# Patient Record
Sex: Male | Born: 1965 | Race: White | Hispanic: No | Marital: Married | Smoking: Former smoker
Health system: Southern US, Community
[De-identification: ages and names within clinical notes are randomized; demographics above are authoritative.]

## PROBLEM LIST (undated history)

## (undated) DIAGNOSIS — E669 Obesity, unspecified: Secondary | ICD-10-CM

## (undated) DIAGNOSIS — K219 Gastro-esophageal reflux disease without esophagitis: Secondary | ICD-10-CM

## (undated) DIAGNOSIS — F32A Depression, unspecified: Secondary | ICD-10-CM

## (undated) DIAGNOSIS — E7800 Pure hypercholesterolemia, unspecified: Secondary | ICD-10-CM

## (undated) DIAGNOSIS — I2510 Atherosclerotic heart disease of native coronary artery without angina pectoris: Secondary | ICD-10-CM

## (undated) DIAGNOSIS — I10 Essential (primary) hypertension: Secondary | ICD-10-CM

## (undated) DIAGNOSIS — Z87891 Personal history of nicotine dependence: Secondary | ICD-10-CM

## (undated) DIAGNOSIS — G4733 Obstructive sleep apnea (adult) (pediatric): Secondary | ICD-10-CM

## (undated) DIAGNOSIS — E039 Hypothyroidism, unspecified: Secondary | ICD-10-CM

## (undated) DIAGNOSIS — R7303 Prediabetes: Secondary | ICD-10-CM

## (undated) DIAGNOSIS — G43909 Migraine, unspecified, not intractable, without status migrainosus: Secondary | ICD-10-CM

## (undated) DIAGNOSIS — N492 Inflammatory disorders of scrotum: Secondary | ICD-10-CM

## (undated) DIAGNOSIS — R413 Other amnesia: Secondary | ICD-10-CM

## (undated) DIAGNOSIS — E079 Disorder of thyroid, unspecified: Secondary | ICD-10-CM

## (undated) DIAGNOSIS — F329 Major depressive disorder, single episode, unspecified: Secondary | ICD-10-CM

## (undated) HISTORY — DX: Obesity, unspecified: E66.9

## (undated) HISTORY — PX: OTHER SURGICAL HISTORY: SHX169

## (undated) HISTORY — DX: Personal history of nicotine dependence: Z87.891

## (undated) HISTORY — DX: Other amnesia: R41.3

## (undated) HISTORY — DX: Atherosclerotic heart disease of native coronary artery without angina pectoris: I25.10

## (undated) HISTORY — DX: Obstructive sleep apnea (adult) (pediatric): G47.33

## (undated) HISTORY — PX: COLONOSCOPY: SHX174

## (undated) HISTORY — DX: Migraine, unspecified, not intractable, without status migrainosus: G43.909

---

## 2016-04-14 ENCOUNTER — Emergency Department (HOSPITAL_COMMUNITY): Payer: 59

## 2016-04-14 ENCOUNTER — Encounter (HOSPITAL_COMMUNITY): Payer: Self-pay

## 2016-04-14 ENCOUNTER — Emergency Department (HOSPITAL_COMMUNITY)
Admission: EM | Admit: 2016-04-14 | Discharge: 2016-04-14 | Disposition: A | Discharge status: Home / Self Care | Payer: 59 | Attending: Emergency Medicine | Admitting: Emergency Medicine

## 2016-04-14 DIAGNOSIS — I10 Essential (primary) hypertension: Secondary | ICD-10-CM | POA: Insufficient documentation

## 2016-04-14 DIAGNOSIS — Z79899 Other long term (current) drug therapy: Secondary | ICD-10-CM | POA: Diagnosis not present

## 2016-04-14 DIAGNOSIS — N50819 Testicular pain, unspecified: Secondary | ICD-10-CM | POA: Diagnosis present

## 2016-04-14 DIAGNOSIS — N451 Epididymitis: Secondary | ICD-10-CM | POA: Insufficient documentation

## 2016-04-14 DIAGNOSIS — N5082 Scrotal pain: Secondary | ICD-10-CM

## 2016-04-14 HISTORY — DX: Essential (primary) hypertension: I10

## 2016-04-14 HISTORY — DX: Disorder of thyroid, unspecified: E07.9

## 2016-04-14 HISTORY — DX: Pure hypercholesterolemia, unspecified: E78.00

## 2016-04-14 LAB — CBC
HCT: 38.3 % — ABNORMAL LOW (ref 39.0–52.0)
HEMOGLOBIN: 12.1 g/dL — AB (ref 13.0–17.0)
MCH: 26.8 pg (ref 26.0–34.0)
MCHC: 31.6 g/dL (ref 30.0–36.0)
MCV: 84.7 fL (ref 78.0–100.0)
Platelets: 328 10*3/uL (ref 150–400)
RBC: 4.52 MIL/uL (ref 4.22–5.81)
RDW: 13.4 % (ref 11.5–15.5)
WBC: 23.8 10*3/uL — ABNORMAL HIGH (ref 4.0–10.5)

## 2016-04-14 LAB — URINALYSIS, ROUTINE W REFLEX MICROSCOPIC
BACTERIA UA: NONE SEEN
BILIRUBIN URINE: NEGATIVE
Glucose, UA: NEGATIVE mg/dL
Hgb urine dipstick: NEGATIVE
KETONES UR: NEGATIVE mg/dL
Leukocytes, UA: NEGATIVE
Nitrite: NEGATIVE
PROTEIN: 100 mg/dL — AB
Specific Gravity, Urine: 1.029 (ref 1.005–1.030)
pH: 5 (ref 5.0–8.0)

## 2016-04-14 LAB — COMPREHENSIVE METABOLIC PANEL
ALT: 38 U/L (ref 17–63)
AST: 21 U/L (ref 15–41)
Albumin: 3.6 g/dL (ref 3.5–5.0)
Alkaline Phosphatase: 109 U/L (ref 38–126)
Anion gap: 12 (ref 5–15)
BILIRUBIN TOTAL: 1 mg/dL (ref 0.3–1.2)
BUN: 10 mg/dL (ref 6–20)
CHLORIDE: 98 mmol/L — AB (ref 101–111)
CO2: 27 mmol/L (ref 22–32)
CREATININE: 0.96 mg/dL (ref 0.61–1.24)
Calcium: 9.2 mg/dL (ref 8.9–10.3)
GFR calc Af Amer: >60 mL/min (ref >60)
GLUCOSE: 126 mg/dL — AB (ref 65–99)
Potassium: 3.5 mmol/L (ref 3.5–5.1)
Sodium: 137 mmol/L (ref 135–145)
Total Protein: 8.4 g/dL — ABNORMAL HIGH (ref 6.5–8.1)

## 2016-04-14 LAB — DIFFERENTIAL
Basophils Absolute: 0 10*3/uL (ref 0.0–0.1)
Basophils Relative: 0 %
EOS PCT: 0 %
Eosinophils Absolute: 0 10*3/uL (ref 0.0–0.7)
LYMPHS ABS: 1.5 10*3/uL (ref 0.7–4.0)
LYMPHS PCT: 6 %
MONO ABS: 2.7 10*3/uL — AB (ref 0.1–1.0)
MONOS PCT: 10 %
NEUTROS PCT: 84 %
Neutro Abs: 21.9 10*3/uL — ABNORMAL HIGH (ref 1.7–7.7)

## 2016-04-14 LAB — LIPASE, BLOOD: LIPASE: 14 U/L (ref 11–51)

## 2016-04-14 MED ORDER — SULFAMETHOXAZOLE-TRIMETHOPRIM 800-160 MG PO TABS: 1.0000 | ORAL_TABLET | Freq: Two times a day (BID) | ORAL | 0 refills | Status: DC

## 2016-04-14 MED ORDER — SULFAMETHOXAZOLE-TRIMETHOPRIM 800-160 MG PO TABS
1.0000 | ORAL_TABLET | Freq: Two times a day (BID) | ORAL | Status: DC
  Administered 2016-04-14: 1 via ORAL
  Filled 2016-04-14: qty 1

## 2016-04-14 MED ORDER — HYDROCODONE-ACETAMINOPHEN 5-325 MG PO TABS
1.0000 | ORAL_TABLET | Freq: Once | ORAL | Status: AC
  Administered 2016-04-14: 1 via ORAL
  Filled 2016-04-14: qty 1

## 2016-04-14 NOTE — ED Notes (Signed)
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 He remains in no distress.
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
## 2016-04-14 NOTE — Discharge Instructions (Signed)
Please call Dr. Noland Fordyce office for appointment for recheck in one week.  Take antibiotics as prescribed.  Please return without fail for worsening symptoms, including worsening pain/swelling, fevers, intractable vomiting, or any other symptoms concerning to you.
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
## 2016-04-15 ENCOUNTER — Other Ambulatory Visit: Payer: Self-pay | Admitting: Family Medicine

## 2016-04-15 DIAGNOSIS — N433 Hydrocele, unspecified: Secondary | ICD-10-CM

## 2016-04-15 LAB — URINE CULTURE: Culture: NO GROWTH

## 2016-04-25 ENCOUNTER — Observation Stay (HOSPITAL_COMMUNITY)
Admission: RE | Admit: 2016-04-25 | Discharge: 2016-04-26 | Disposition: A | Discharge status: Home / Self Care | Payer: 59 | Source: Ambulatory Visit | Attending: Urology | Admitting: Urology

## 2016-04-25 ENCOUNTER — Inpatient Hospital Stay (HOSPITAL_COMMUNITY): Payer: 59 | Admitting: Anesthesiology

## 2016-04-25 ENCOUNTER — Encounter (HOSPITAL_COMMUNITY)
Admission: RE | Disposition: A | Discharge status: Home / Self Care | Payer: Self-pay | Source: Ambulatory Visit | Attending: Urology

## 2016-04-25 ENCOUNTER — Other Ambulatory Visit: Payer: Self-pay | Admitting: Urology

## 2016-04-25 ENCOUNTER — Encounter (HOSPITAL_COMMUNITY): Payer: Self-pay | Admitting: *Deleted

## 2016-04-25 DIAGNOSIS — N454 Abscess of epididymis or testis: Secondary | ICD-10-CM | POA: Diagnosis not present

## 2016-04-25 DIAGNOSIS — I10 Essential (primary) hypertension: Secondary | ICD-10-CM | POA: Diagnosis not present

## 2016-04-25 DIAGNOSIS — Z87891 Personal history of nicotine dependence: Secondary | ICD-10-CM | POA: Insufficient documentation

## 2016-04-25 DIAGNOSIS — K219 Gastro-esophageal reflux disease without esophagitis: Secondary | ICD-10-CM | POA: Diagnosis not present

## 2016-04-25 DIAGNOSIS — N452 Orchitis: Secondary | ICD-10-CM | POA: Insufficient documentation

## 2016-04-25 DIAGNOSIS — S3131XA Laceration without foreign body of scrotum and testes, initial encounter: Secondary | ICD-10-CM | POA: Diagnosis present

## 2016-04-25 DIAGNOSIS — N501 Vascular disorders of male genital organs: Secondary | ICD-10-CM | POA: Insufficient documentation

## 2016-04-25 DIAGNOSIS — N492 Inflammatory disorders of scrotum: Secondary | ICD-10-CM | POA: Diagnosis present

## 2016-04-25 HISTORY — DX: Depression, unspecified: F32.A

## 2016-04-25 HISTORY — DX: Hypothyroidism, unspecified: E03.9

## 2016-04-25 HISTORY — DX: Major depressive disorder, single episode, unspecified: F32.9

## 2016-04-25 HISTORY — DX: Gastro-esophageal reflux disease without esophagitis: K21.9

## 2016-04-25 HISTORY — DX: Inflammatory disorders of scrotum: N49.2

## 2016-04-25 HISTORY — DX: Prediabetes: R73.03

## 2016-04-25 HISTORY — PX: INCISION AND DRAINAGE ABSCESS: SHX5864

## 2016-04-25 HISTORY — PX: ORCHIECTOMY: SHX2116

## 2016-04-25 LAB — CBC
HCT: 38.5 % — ABNORMAL LOW (ref 39.0–52.0)
Hemoglobin: 12.5 g/dL — ABNORMAL LOW (ref 13.0–17.0)
MCH: 27.8 pg (ref 26.0–34.0)
MCHC: 32.5 g/dL (ref 30.0–36.0)
MCV: 85.7 fL (ref 78.0–100.0)
PLATELETS: 413 10*3/uL — AB (ref 150–400)
RBC: 4.49 MIL/uL (ref 4.22–5.81)
RDW: 14 % (ref 11.5–15.5)
WBC: 20.8 10*3/uL — AB (ref 4.0–10.5)

## 2016-04-25 SURGERY — INCISION AND DRAINAGE, ABSCESS
Anesthesia: General | Laterality: Right

## 2016-04-25 MED ORDER — ONDANSETRON HCL 4 MG/2ML IJ SOLN: 4.0000 mg | INTRAMUSCULAR | Status: DC | PRN

## 2016-04-25 MED ORDER — METOCLOPRAMIDE HCL 5 MG/ML IJ SOLN: 10.0000 mg | Freq: Once | INTRAMUSCULAR | Status: DC | PRN

## 2016-04-25 MED ORDER — OXYCODONE HCL 5 MG PO TABS
5.0000 mg | ORAL_TABLET | ORAL | Status: DC | PRN
  Administered 2016-04-25 – 2016-04-26 (×2): 5 mg via ORAL
  Filled 2016-04-25 (×2): qty 1

## 2016-04-25 MED ORDER — MORPHINE SULFATE (PF) 4 MG/ML IV SOLN: 2.0000 mg | INTRAVENOUS | Status: DC | PRN

## 2016-04-25 MED ORDER — LACTATED RINGERS IV SOLN
INTRAVENOUS | Status: DC
  Administered 2016-04-25 – 2016-04-26 (×4): via INTRAVENOUS

## 2016-04-25 MED ORDER — PROPOFOL 10 MG/ML IV BOLUS
INTRAVENOUS | Status: AC
  Filled 2016-04-25: qty 20

## 2016-04-25 MED ORDER — PROPOFOL 10 MG/ML IV BOLUS
INTRAVENOUS | Status: DC | PRN
  Administered 2016-04-25: 200 mg via INTRAVENOUS

## 2016-04-25 MED ORDER — FENTANYL CITRATE (PF) 100 MCG/2ML IJ SOLN
25.0000 ug | INTRAMUSCULAR | Status: DC | PRN
  Administered 2016-04-25 (×2): 50 ug via INTRAVENOUS

## 2016-04-25 MED ORDER — CEFAZOLIN SODIUM-DEXTROSE 2-4 GM/100ML-% IV SOLN
INTRAVENOUS | Status: AC
  Filled 2016-04-25: qty 100

## 2016-04-25 MED ORDER — DEXAMETHASONE SODIUM PHOSPHATE 10 MG/ML IJ SOLN
INTRAMUSCULAR | Status: AC
Start: 2016-04-25 — End: 2016-04-25
  Filled 2016-04-25: qty 1

## 2016-04-25 MED ORDER — GENTAMICIN SULFATE 40 MG/ML IJ SOLN
400.0000 mg | Freq: Once | INTRAMUSCULAR | Status: AC
  Administered 2016-04-25: 400 mg via INTRAVENOUS
  Filled 2016-04-25: qty 10

## 2016-04-25 MED ORDER — MIDAZOLAM HCL 5 MG/5ML IJ SOLN
INTRAMUSCULAR | Status: DC | PRN
  Administered 2016-04-25: 2 mg via INTRAVENOUS

## 2016-04-25 MED ORDER — ACETAMINOPHEN 10 MG/ML IV SOLN
INTRAVENOUS | Status: AC
Start: 2016-04-25 — End: 2016-04-25
  Filled 2016-04-25: qty 100

## 2016-04-25 MED ORDER — ONDANSETRON HCL 4 MG/2ML IJ SOLN
INTRAMUSCULAR | Status: DC | PRN
Start: 2016-04-25 — End: 2016-04-25
  Administered 2016-04-25: 4 mg via INTRAVENOUS

## 2016-04-25 MED ORDER — ACETAMINOPHEN 325 MG PO TABS
650.0000 mg | ORAL_TABLET | ORAL | Status: DC | PRN
  Administered 2016-04-26: 650 mg via ORAL
  Filled 2016-04-25: qty 2

## 2016-04-25 MED ORDER — MEPERIDINE HCL 50 MG/ML IJ SOLN: 6.2500 mg | INTRAMUSCULAR | Status: DC | PRN

## 2016-04-25 MED ORDER — SODIUM CHLORIDE 0.9% FLUSH: 3.0000 mL | INTRAVENOUS | Status: DC | PRN

## 2016-04-25 MED ORDER — ACETAMINOPHEN 10 MG/ML IV SOLN
INTRAVENOUS | Status: DC | PRN
  Administered 2016-04-25: 1000 mg via INTRAVENOUS

## 2016-04-25 MED ORDER — SODIUM CHLORIDE 0.9 % IV SOLN: 250.0000 mL | INTRAVENOUS | Status: DC | PRN

## 2016-04-25 MED ORDER — METOPROLOL TARTRATE 50 MG PO TABS
50.0000 mg | ORAL_TABLET | Freq: Two times a day (BID) | ORAL | Status: DC
  Administered 2016-04-25 – 2016-04-26 (×2): 50 mg via ORAL
  Filled 2016-04-25 (×2): qty 1

## 2016-04-25 MED ORDER — PRAVASTATIN SODIUM 40 MG PO TABS
40.0000 mg | ORAL_TABLET | Freq: Every day | ORAL | Status: DC
  Administered 2016-04-26: 40 mg via ORAL
  Filled 2016-04-25 (×2): qty 1

## 2016-04-25 MED ORDER — MIDAZOLAM HCL 2 MG/2ML IJ SOLN
INTRAMUSCULAR | Status: AC
Start: 2016-04-25 — End: 2016-04-25
  Filled 2016-04-25: qty 2

## 2016-04-25 MED ORDER — CEFAZOLIN SODIUM-DEXTROSE 2-4 GM/100ML-% IV SOLN
2.0000 g | Freq: Once | INTRAVENOUS | Status: AC
  Administered 2016-04-25: 2 g via INTRAVENOUS

## 2016-04-25 MED ORDER — FENTANYL CITRATE (PF) 100 MCG/2ML IJ SOLN
INTRAMUSCULAR | Status: DC | PRN
  Administered 2016-04-25 (×5): 50 ug via INTRAVENOUS

## 2016-04-25 MED ORDER — SODIUM CHLORIDE 0.9% FLUSH: 3.0000 mL | Freq: Two times a day (BID) | INTRAVENOUS | Status: DC

## 2016-04-25 MED ORDER — BUPIVACAINE HCL (PF) 0.25 % IJ SOLN
INTRAMUSCULAR | Status: AC
Start: 2016-04-25 — End: 2016-04-25
  Filled 2016-04-25: qty 30

## 2016-04-25 MED ORDER — HYDROMORPHONE HCL 2 MG/ML IJ SOLN
INTRAMUSCULAR | Status: AC
  Filled 2016-04-25: qty 1

## 2016-04-25 MED ORDER — FENTANYL CITRATE (PF) 100 MCG/2ML IJ SOLN
INTRAMUSCULAR | Status: AC
  Filled 2016-04-25: qty 2

## 2016-04-25 MED ORDER — LIDOCAINE HCL (CARDIAC) 10 MG/ML IV SOLN
INTRAVENOUS | Status: DC | PRN
  Administered 2016-04-25: 75 mg via INTRAVENOUS

## 2016-04-25 MED ORDER — LACTATED RINGERS IV SOLN: INTRAVENOUS | Status: DC

## 2016-04-25 MED ORDER — FENTANYL CITRATE (PF) 250 MCG/5ML IJ SOLN
INTRAMUSCULAR | Status: AC
  Filled 2016-04-25: qty 5

## 2016-04-25 MED ORDER — HYDROMORPHONE HCL 1 MG/ML IJ SOLN
INTRAMUSCULAR | Status: DC | PRN
  Administered 2016-04-25: 1 mg via INTRAVENOUS

## 2016-04-25 MED ORDER — LEVOTHYROXINE SODIUM 75 MCG PO TABS
150.0000 ug | ORAL_TABLET | Freq: Every day | ORAL | Status: DC
  Administered 2016-04-26: 150 ug via ORAL
  Filled 2016-04-25: qty 2

## 2016-04-25 MED ORDER — ONDANSETRON HCL 4 MG/2ML IJ SOLN
INTRAMUSCULAR | Status: AC
Start: 2016-04-25 — End: 2016-04-25
  Filled 2016-04-25: qty 2

## 2016-04-25 MED ORDER — FLUOXETINE HCL 20 MG PO TABS
20.0000 mg | ORAL_TABLET | Freq: Every day | ORAL | Status: DC
  Filled 2016-04-25 (×2): qty 1

## 2016-04-25 MED ORDER — TRIAMTERENE-HCTZ 37.5-25 MG PO TABS
1.0000 | ORAL_TABLET | Freq: Every day | ORAL | Status: DC
  Administered 2016-04-26: 1 via ORAL
  Filled 2016-04-25: qty 1

## 2016-04-25 SURGICAL SUPPLY — 30 items
BLADE HEX COATED 2.75 (ELECTRODE) IMPLANT
BNDG GAUZE ELAST 4 BULKY (GAUZE/BANDAGES/DRESSINGS) IMPLANT
COVER SURGICAL LIGHT HANDLE (MISCELLANEOUS) ×4 IMPLANT
DRAIN CHANNEL 19F RND (DRAIN) ×4 IMPLANT
DRAIN PENROSE 18X1/2 LTX STRL (DRAIN) ×4 IMPLANT
DRAPE LAPAROTOMY T 98X78 PEDS (DRAPES) ×4 IMPLANT
ELECT REM PT RETURN 15FT ADLT (MISCELLANEOUS) ×4 IMPLANT
EVACUATOR SILICONE 100CC (DRAIN) ×4 IMPLANT
GLOVE BIOGEL M STRL SZ7.5 (GLOVE) ×24 IMPLANT
GOWN STRL REUS W/TWL XL LVL3 (GOWN DISPOSABLE) ×16 IMPLANT
KIT BASIN OR (CUSTOM PROCEDURE TRAY) ×4 IMPLANT
NEEDLE HYPO 22GX1.5 SAFETY (NEEDLE) ×4 IMPLANT
NS IRRIG 1000ML POUR BTL (IV SOLUTION) ×4 IMPLANT
PACK GENERAL/GYN (CUSTOM PROCEDURE TRAY) ×4 IMPLANT
SUPPORT SCROTAL LG STRP (MISCELLANEOUS) ×3 IMPLANT
SUPPORTER ATHLETIC LG (MISCELLANEOUS) ×1
SUT CHROMIC 3 0 SH 27 (SUTURE) ×24 IMPLANT
SUT SILK 0 (SUTURE) ×2
SUT SILK 0 30XBRD TIE 6 (SUTURE) ×2 IMPLANT
SUT SILK 0 SH 30 (SUTURE) ×16 IMPLANT
SUT SILK 1 TIES 10/18 (SUTURE) ×4 IMPLANT
SUT SILK 2 0 30  PSL (SUTURE) ×2
SUT SILK 2 0 30 PSL (SUTURE) ×2 IMPLANT
SUT VIC AB 0 CT2 27 (SUTURE) ×4 IMPLANT
SUT VIC AB 2-0 UR5 27 (SUTURE) IMPLANT
SUT VICRYL 0 TIES 12 18 (SUTURE) IMPLANT
SWAB CULTURE ESWAB REG 1ML (MISCELLANEOUS) ×8 IMPLANT
SYR CONTROL 10ML LL (SYRINGE) ×4 IMPLANT
TOWEL OR 17X26 10 PK STRL BLUE (TOWEL DISPOSABLE) ×8 IMPLANT
WATER STERILE IRR 1500ML POUR (IV SOLUTION) IMPLANT

## 2016-04-25 NOTE — Anesthesia Preprocedure Evaluation (Signed)
Anesthesia Evaluation  Patient identified by MRN, date of birth, ID band Patient awake    Reviewed: Allergy & Precautions, NPO status , Patient's Chart, lab work & pertinent test results  Airway Mallampati: II  TM Distance: >3 FB Neck ROM: Full    Dental no notable dental hx. (+) Edentulous Upper, Edentulous Lower   Pulmonary neg pulmonary ROS, former smoker,    Pulmonary exam normal breath sounds clear to auscultation       Cardiovascular hypertension, Pt. on medications Normal cardiovascular exam Rhythm:Regular Rate:Normal     Neuro/Psych negative neurological ROS  negative psych ROS   GI/Hepatic Neg liver ROS, GERD  Controlled,  Endo/Other  negative endocrine ROS  Renal/GU negative Renal ROS  negative genitourinary   Musculoskeletal negative musculoskeletal ROS (+)   Abdominal   Peds negative pediatric ROS (+)  Hematology negative hematology ROS (+)   Anesthesia Other Findings   Reproductive/Obstetrics negative OB ROS                             Anesthesia Physical Anesthesia Plan  ASA: II  Anesthesia Plan: General   Post-op Pain Management:    Induction: Intravenous  Airway Management Planned: LMA  Additional Equipment:   Intra-op Plan:   Post-operative Plan: Extubation in OR  Informed Consent: I have reviewed the patients History and Physical, chart, labs and discussed the procedure including the risks, benefits and alternatives for the proposed anesthesia with the patient or authorized representative who has indicated his/her understanding and acceptance.   Dental advisory given  Plan Discussed with: CRNA  Anesthesia Plan Comments:         Anesthesia Quick Evaluation

## 2016-04-25 NOTE — Op Note (Signed)
Preoperative diagnosis: Infarcted right testicle and right scrotal abscess Postoperative diagnosis: Infarcted right testicle and right scrotal abscess and ruptured right testicle Surgery: Incision and drainage of right scrotal abscess; right orchiectomy; cultures sent Surgeon: Dr. Bjorn Loser  The patient had the above diagnoses and consented the above procedure. The findings in the operating room were similar to my note in the office. I made a 6-7 cm transverse right scrotal incision after marking it with a blue marker. Immediately there was pus sent for aerobic and anaerobic cultures. It was not foul-smelling. It was more superficial. The indurated subcutaneous tissues at both apex bled due to the friability of the tissue and inflammation. This was easily controlled with cautery.  At first it was difficult to sort out the tissue planes. It then became obvious that the patient had a large right testicle with tunica albuginea that I could see but a rupture of 1/3 the size of a golf ball in the lower pole of the testicle. I was able to finger dissect the plane between the testicle and inflamed soft tissues towards the cord. One could visualize the seminiferous tubules protruding through the ruptured tunica.  There was very good exposure because of the length of the incision. The cord was very thick. At a comfortable control level I did the orchiectomy. Before proceeding I had taken down some soft tissue with sweeping maneuvers cauterizing small superficial vessels. In spite of this the cord is very thick.  I actually decided to take the cord in 3 sections and used a large Kelly clamp and did one section at a time. I actually would use cautery to incise the adventitia and delivered the clamp. I then slowly cut across one third of the cord keeping length distal to the clamp. I then suture ligated with 0 silk. I then used #1 silk free tied twice trying to get different levels of tissue. There was some  overlap. The clamp was then released and the tissue was dry. I did the exact technique taking the second third and then the final third in sequence. They were all triple tied. The cord retracted minimally and with gentle retractors I could observe it and it was dry.  A large volume of irrigation was utilized. I spent several minutes making certain that the soft tissue planes and subcutaneous tissue was dry and cauterized minimal bleeders. I then double checked the cord with my retractors and the cord was dry  I delivered a Jackson-Pratt drain through the dependent right scrotum into the cavity that was around the testicle. It was fashioned in place and sewed to the skin with a 2-0 silk. The tissues actually looked reasonably healthy and because I felt the underlying infarcted testicle now removed and I could close most of the tissue planes loosely. I loosely closed the friable subcutaneous tissue with interrupted widely spaced 3-0 chromic. I did the same for the skin widely space with same suture.  There was only a few milliliters of blood in the Jackson-Pratt drain.  I was very pleased with the surgery. The patient was washed and a very generous dry dressing with two ABD pads, a Telfa and 2 large Kerlix were used with mesh pants for good scrotal compression postoperatively. The patient was taken to recovery. He had been given loading doses of gentamicin and Ancef

## 2016-04-25 NOTE — Interval H&P Note (Signed)
History and Physical Interval Note:  04/25/2016 4:08 PM  Benjamin Hodges  has presented today for surgery, with the diagnosis of scrotal abscess  The various methods of treatment have been discussed with the patient and family. After consideration of risks, benefits and other options for treatment, the patient has consented to  Procedure(s): INCISION AND DRAINAGE SCROTAL ABSCESS (N/A) POSSIBLE RIGHT ORCHIECTOMY (Right) as a surgical intervention .  The patient's history has been reviewed, patient examined, no change in status, stable for surgery.  I have reviewed the patient's chart and labs.  Questions were answered to the patient's satisfaction.     [name]

## 2016-04-25 NOTE — Transfer of Care (Signed)
Immediate Anesthesia Transfer of Care Note  Patient: Benjamin Hodges  Procedure(s) Performed: Procedure(s): INCISION AND DRAINAGE SCROTAL ABSCESS (N/A) RIGHT ORCHIECTOMY (Right)  Patient Location: PACU  Anesthesia Type:General  Level of Consciousness: awake and alert   Airway & Oxygen Therapy: Patient Spontanous Breathing and Patient connected to face mask oxygen  Post-op Assessment: Report given to RN and Post -op Vital signs reviewed and stable  Post vital signs: Reviewed and stable  Last Vitals:  Vitals:   04/25/16 1655  BP: 119/66  Pulse: 83  Resp: 16  Temp: 37.9 C    Last Pain:  Vitals:   04/25/16 1655  TempSrc: Oral  PainSc: 4       Patients Stated Pain Goal: 2 (15/94/58 5929)  Complications: No apparent anesthesia complications

## 2016-04-25 NOTE — Anesthesia Procedure Notes (Signed)
Procedure Name: LMA Insertion Date/Time: 04/25/2016 6:09 PM Performed by: Enrigue Catena E Pre-anesthesia Checklist: Patient identified, Emergency Drugs available, Suction available and Patient being monitored Patient Re-evaluated:Patient Re-evaluated prior to inductionOxygen Delivery Method: Circle system utilized Preoxygenation: Pre-oxygenation with 100% oxygen Intubation Type: IV induction Ventilation: Mask ventilation without difficulty LMA: LMA with gastric port inserted LMA Size: 5.0 Tube type: Oral Number of attempts: 1 Airway Equipment and Method: Oral airway Placement Confirmation: positive ETCO2 Tube secured with: Tape Dental Injury: Teeth and Oropharynx as per pre-operative assessment
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
## 2016-04-26 ENCOUNTER — Encounter (HOSPITAL_COMMUNITY): Payer: Self-pay | Admitting: Urology

## 2016-04-26 DIAGNOSIS — N454 Abscess of epididymis or testis: Secondary | ICD-10-CM | POA: Diagnosis not present

## 2016-04-26 LAB — HIV ANTIBODY (ROUTINE TESTING W REFLEX): HIV SCREEN 4TH GENERATION: NONREACTIVE

## 2016-04-26 MED ORDER — FLUOXETINE HCL 20 MG PO CAPS
20.0000 mg | ORAL_CAPSULE | Freq: Every day | ORAL | Status: DC
  Filled 2016-04-26: qty 1

## 2016-04-26 MED ORDER — CEPHALEXIN 500 MG PO CAPS
500.0000 mg | ORAL_CAPSULE | Freq: Three times a day (TID) | ORAL | 0 refills | Status: DC
Start: 2016-04-26 — End: 2019-09-24

## 2016-04-26 MED ORDER — HYDROCODONE-ACETAMINOPHEN 5-325 MG PO TABS: 1.0000 | ORAL_TABLET | Freq: Four times a day (QID) | ORAL | 0 refills | Status: DC | PRN

## 2016-04-26 NOTE — Discharge Instructions (Signed)
I have reviewed discharge instructions in detail with the patient. They will follow-up with me or their physician as scheduled. My nurse will also be calling the patients as per protocol.   

## 2016-04-26 NOTE — Progress Notes (Signed)
Looks good Minimal to no pain Scrotum mildly swollen and incision look good Drain minimal Plan: DC drain and DC home Detailed post op

## 2016-04-26 NOTE — Progress Notes (Signed)
Patient discharged home with wife. Pain well under control with PO pain meds. Pt able to ambulate at least 360 feet in hallway independently- tolerated well. Tolerating regular diet with no difficulties. Able to void. JP Drain removed as ordered with no complications. ABD pad dressing around scrotum in mesh underwear. Minimal drainage noted this AM. Discharge paperwork explained and printed prescriptions given to patient. Pt and wife verbalized understanding- all questions answered. Pt assisted to vehicle via W/C.

## 2016-04-29 NOTE — Anesthesia Postprocedure Evaluation (Signed)
Anesthesia Post Note  Patient: Benjamin Hodges  Procedure(s) Performed: Procedure(s) (LRB): INCISION AND DRAINAGE SCROTAL ABSCESS (N/A) RIGHT ORCHIECTOMY (Right)  Patient location during evaluation: PACU Anesthesia Type: General Level of consciousness: awake and alert Pain management: pain level controlled Vital Signs Assessment: post-procedure vital signs reviewed and stable Respiratory status: spontaneous breathing, nonlabored ventilation, respiratory function stable and patient connected to nasal cannula oxygen Cardiovascular status: blood pressure returned to baseline and stable Postop Assessment: no signs of nausea or vomiting Anesthetic complications: no       Last Vitals:  Vitals:   04/25/16 2027 04/26/16 0616  BP: 133/82 115/71  Pulse: 75 (!) 56  Resp: 15 17  Temp: 36.9 C 36.6 C    Last Pain:  Vitals:   04/26/16 0616  TempSrc: Oral  PainSc:                  [name]

## 2016-04-30 LAB — AEROBIC/ANAEROBIC CULTURE W GRAM STAIN (SURGICAL/DEEP WOUND)

## 2016-04-30 LAB — AEROBIC/ANAEROBIC CULTURE (SURGICAL/DEEP WOUND)

## 2016-04-30 NOTE — Discharge Summary (Signed)
Date of admission: 04/25/2016  Date of discharge: 04/30/2016  Admission diagnosis:Scrotal abscess  Discharge diagnosis: Scrotal abscess and infarcted/ruptured testicle  Secondary diagnoses:r uptured testicle  History and Physical: For full details, please see admission history and physical. Briefly, Benjamin Hodges is a 51 y.o. year old patient with the above diagnosis.   Hospital Course: I and D of scrotal abscess and removal of rt testicle  Laboratory values: No results for input(s): HGB, HCT in the last 72 hours. No results for input(s): CREATININE in the last 72 hours.  Disposition: Home  Discharge instruction: The patient was instructed to be ambulatory but told to refrain from heavy lifting, strenuous activity, or driving. Detailed  Discharge medications:  Allergies as of 04/26/2016      Reactions   Niacin And Related Hives      Medication List    STOP taking these medications   ciprofloxacin 500 MG tablet Commonly known as:  CIPRO   sulfamethoxazole-trimethoprim 800-160 MG tablet Commonly known as:  BACTRIM DS,SEPTRA DS     TAKE these medications   cephALEXin 500 MG capsule Commonly known as:  KEFLEX Take 1 capsule (500 mg total) by mouth 3 (three) times daily.   FLUoxetine 20 MG tablet Commonly known as:  PROZAC Take 20 mg by mouth daily. Pt states has not had in last 3 weeks;   HYDROcodone-acetaminophen 5-325 MG tablet Commonly known as:  NORCO Take 1-2 tablets by mouth every 6 (six) hours as needed for moderate pain.   ibuprofen 200 MG tablet Commonly known as:  ADVIL,MOTRIN Take 600 mg by mouth every 6 (six) hours as needed.   levothyroxine 150 MCG tablet Commonly known as:  SYNTHROID, LEVOTHROID Take 150 mcg by mouth daily before breakfast.   metoprolol 50 MG tablet Commonly known as:  LOPRESSOR Take 50 mg by mouth 2 (two) times daily.   multivitamin with minerals Tabs tablet Take 2 tablets by mouth daily.   naproxen sodium 220 MG  tablet Commonly known as:  ANAPROX Take 220-440 mg by mouth 2 (two) times daily with a meal.   pravastatin 40 MG tablet Commonly known as:  PRAVACHOL Take 40 mg by mouth daily.   triamterene-hydrochlorothiazide 37.5-25 MG tablet Commonly known as:  MAXZIDE-25 Take 1 tablet by mouth daily.       Followup:  Follow-up Information    Quana Chamberlain A, MD Follow up.   Specialty:  Urology Why:  we will call him Contact information: [place] [address] [phone]

## 2018-01-29 ENCOUNTER — Encounter (HOSPITAL_COMMUNITY): Payer: Self-pay

## 2018-01-29 ENCOUNTER — Emergency Department (HOSPITAL_COMMUNITY)
Admission: EM | Admit: 2018-01-29 | Discharge: 2018-01-29 | Disposition: A | Discharge status: Home / Self Care | Payer: 59 | Attending: Emergency Medicine | Admitting: Emergency Medicine

## 2018-01-29 DIAGNOSIS — R519 Headache, unspecified: Secondary | ICD-10-CM

## 2018-01-29 DIAGNOSIS — Z79899 Other long term (current) drug therapy: Secondary | ICD-10-CM | POA: Diagnosis not present

## 2018-01-29 DIAGNOSIS — Z87891 Personal history of nicotine dependence: Secondary | ICD-10-CM | POA: Diagnosis not present

## 2018-01-29 DIAGNOSIS — E7800 Pure hypercholesterolemia, unspecified: Secondary | ICD-10-CM | POA: Insufficient documentation

## 2018-01-29 DIAGNOSIS — R51 Headache: Secondary | ICD-10-CM | POA: Diagnosis not present

## 2018-01-29 DIAGNOSIS — I10 Essential (primary) hypertension: Secondary | ICD-10-CM | POA: Insufficient documentation

## 2018-01-29 DIAGNOSIS — J00 Acute nasopharyngitis [common cold]: Secondary | ICD-10-CM | POA: Diagnosis not present

## 2018-01-29 DIAGNOSIS — E039 Hypothyroidism, unspecified: Secondary | ICD-10-CM | POA: Insufficient documentation

## 2018-01-29 DIAGNOSIS — I158 Other secondary hypertension: Secondary | ICD-10-CM

## 2018-01-29 MED ORDER — ACETAMINOPHEN 500 MG PO TABS
1000.0000 mg | ORAL_TABLET | Freq: Once | ORAL | Status: AC
Start: 2018-01-29 — End: 2018-01-29
  Administered 2018-01-29: 1000 mg via ORAL
  Filled 2018-01-29: qty 2
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
## 2018-01-29 NOTE — ED Notes (Signed)
Pt verbalizes understanding of d/c instructions. Pt ambulatory at d/c with all belongings and with family.   

## 2018-01-29 NOTE — ED Triage Notes (Signed)
Pt states that his BP has been high for the past week causing him a bad headache, recent changes in his BP meds.

## 2018-01-29 NOTE — ED Notes (Signed)
ED Provider at bedside. 

## 2018-05-23 IMAGING — US US SCROTUM
1 series · 13 of 25 positions shown · non-contrast
Comparison: None.

CLINICAL DATA: Right testicular swelling 2 weeks.

EXAM:
SCROTAL ULTRASOUND
DOPPLER ULTRASOUND OF THE TESTICLES
TECHNIQUE: Complete ultrasound examination of the testicles, epididymis, and
other scrotal structures was performed. Color and spectral Doppler
ultrasound were also utilized to evaluate blood flow to the
testicles.

[Series 1: us scrotum · 0.09mm/px · 13 of 87 slices shown]
[im 1/87]
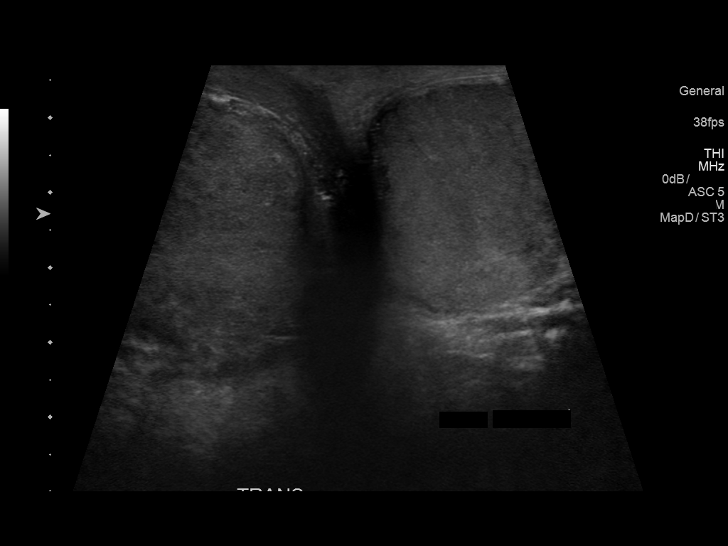
[im 8/87]
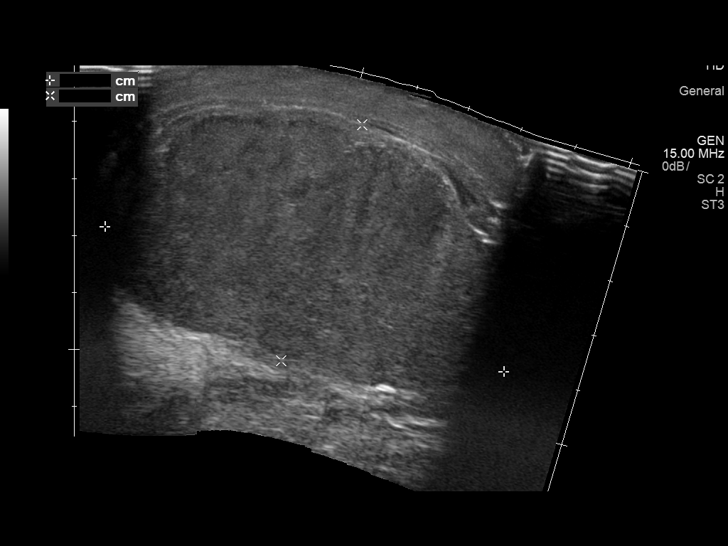
[im 15/87]
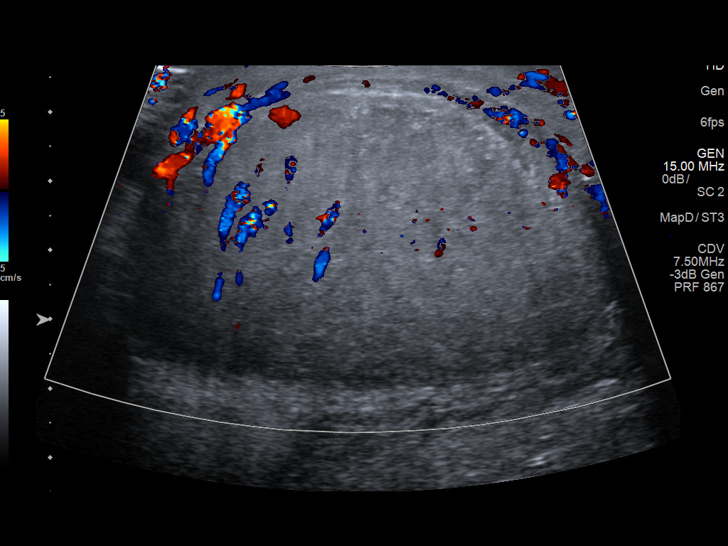
[im 22/87]
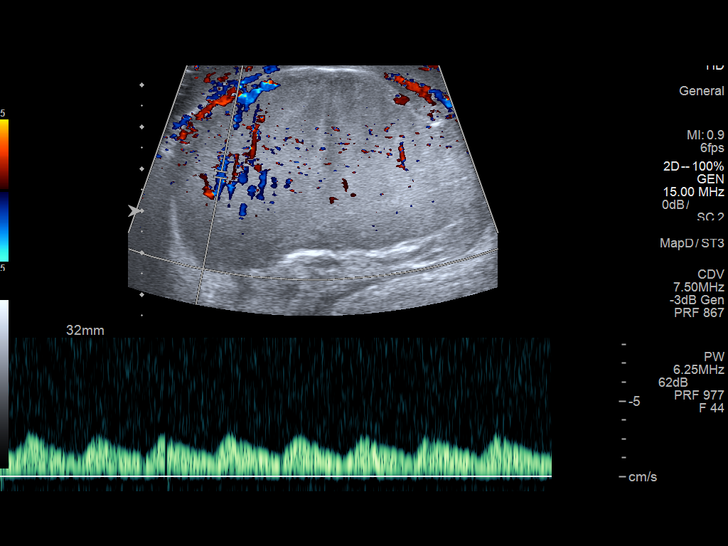
[im 29/87]
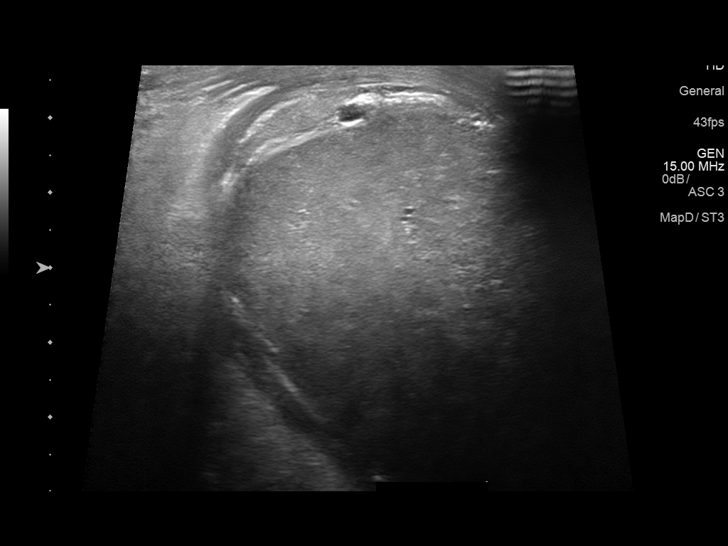
[im 36/87]
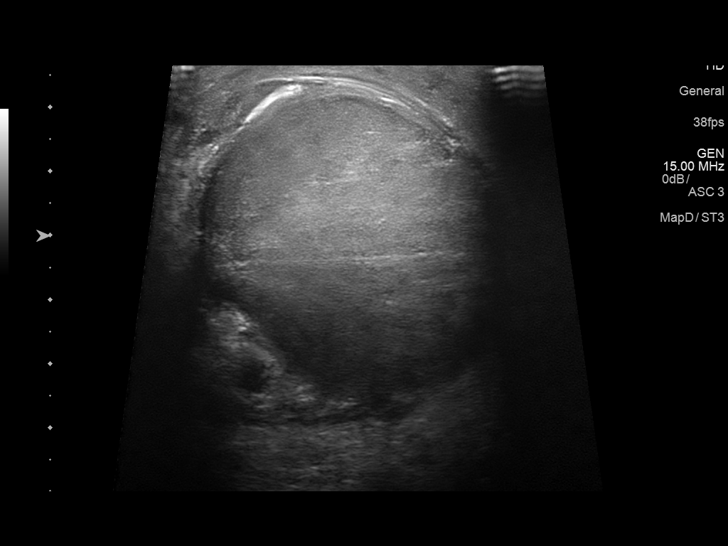
[im 44/87]
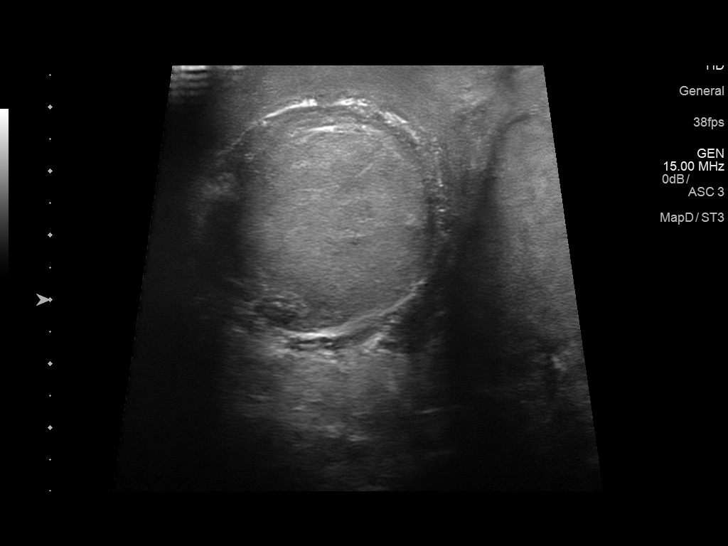
[im 51/87]
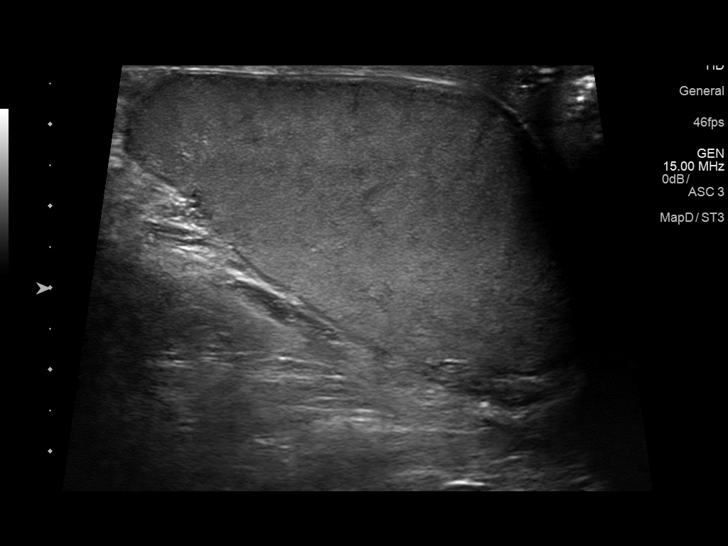
[im 58/87]
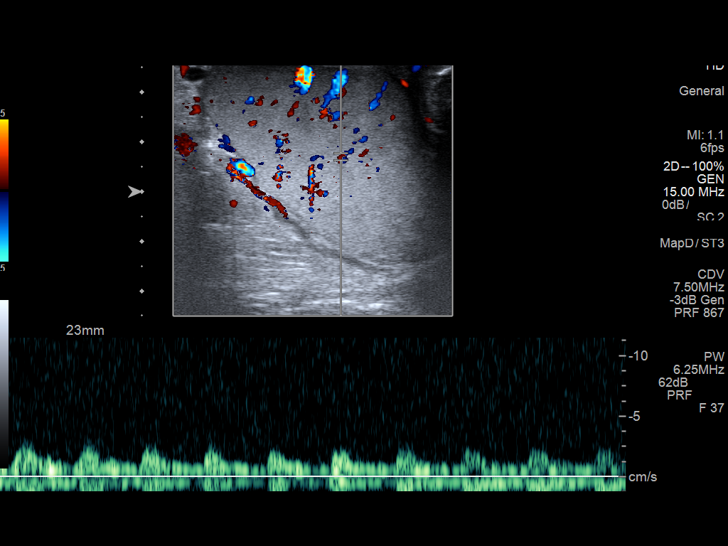
[im 65/87]
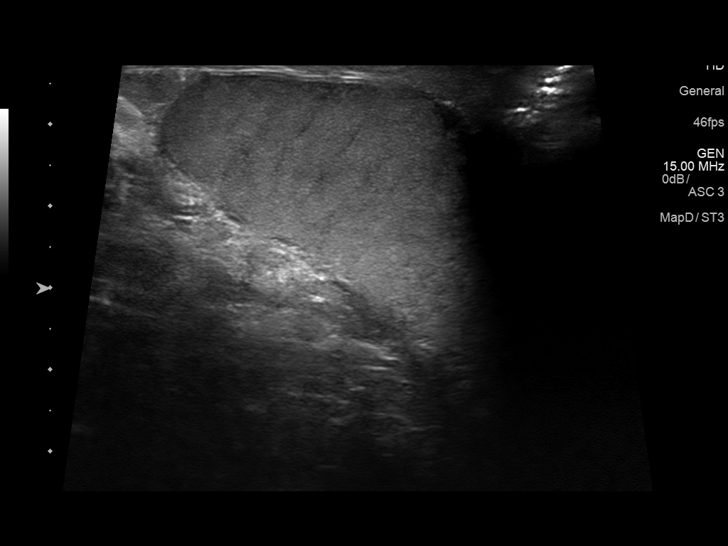
[im 72/87]
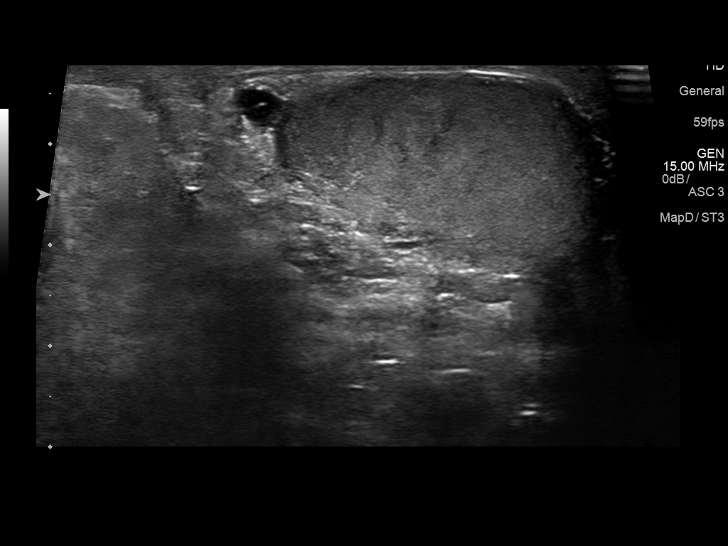
[im 79/87]
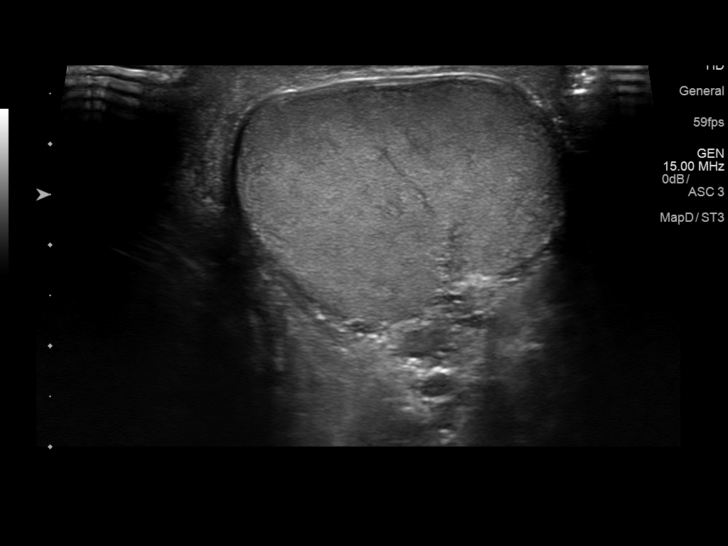
[im 87/87]
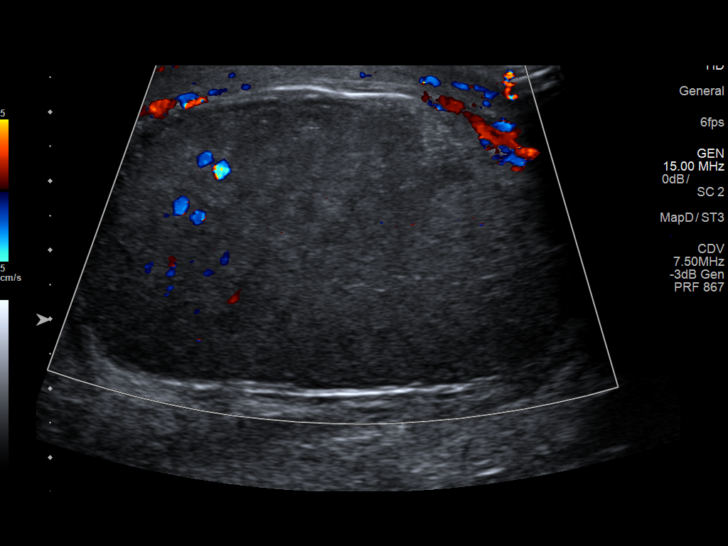

[13 of 25 positions shown; findings below may reference images not displayed]

FINDINGS: Right testicle

Measurements: 4.4 x 4.9 x 8.4 cm. Mildly enlarged with diffuse
heterogeneity.

Left testicle

Measurements: 3.4 x 3.2 x 6.2 cm. No mass or microlithiasis
visualized.

Right epididymis:  Normal in size and appearance.

Left epididymis:  4 mm cyst over the epididymal head.

Hydrocele: Very small amount left scrotal fluid. No right-sided
hydrocele.

Pulsed Doppler interrogation of both testes demonstrates normal low
resistance arterial and venous waveforms left testicle. Normal
arterial and venous flow to the mid to upper half of the right
testicle with decreased flow to the lower half of the right
testicle. Mild increased vascularity around the periphery of the
right testicle.

Generalized scrotal wall thickening.
IMPRESSION: Enlarged, heterogeneous right testicle measuring 4.4 x 4.9 x 8.4 cm.
There is normal flow over the upper half of the right testicle with
decreased flow to the lower half as well as increased peritesticular
vascularity. In keeping with patient's history, this is likely
delayed torsion with infarction and mild reperfusion. Neoplasm is
not completely excluded. Recommend urologic consultation.

Normal left testicle.  Tiny left hydrocele.

4 mm left epididymal cyst.

## 2019-06-01 ENCOUNTER — Encounter: Payer: Self-pay | Admitting: Gastroenterology

## 2019-08-20 ENCOUNTER — Encounter: Payer: PRIVATE HEALTH INSURANCE | Admitting: Gastroenterology

## 2019-09-24 ENCOUNTER — Ambulatory Visit (AMBULATORY_SURGERY_CENTER): Payer: Self-pay | Admitting: *Deleted

## 2019-09-24 ENCOUNTER — Other Ambulatory Visit: Payer: Self-pay

## 2019-09-24 VITALS — Ht 67.0 in | Wt 216.4 lb

## 2019-09-24 DIAGNOSIS — Z01818 Encounter for other preprocedural examination: Secondary | ICD-10-CM

## 2019-09-24 DIAGNOSIS — Z1211 Encounter for screening for malignant neoplasm of colon: Secondary | ICD-10-CM

## 2019-09-24 NOTE — Progress Notes (Signed)
Patient denies any allergies to egg or soy products. Patient denies complications with anesthesia/sedation.  Patient denies oxygen use at home and denies diet medications. Emmi instructions for colonoscopy explained and given to patient.  Covid test scheduled for Tues, 10/05/19 at [place].  Patient unable to have  covid test on Wednesday 10/06/19.

## 2019-10-05 ENCOUNTER — Other Ambulatory Visit: Payer: Self-pay | Admitting: Gastroenterology

## 2019-10-05 ENCOUNTER — Ambulatory Visit (INDEPENDENT_AMBULATORY_CARE_PROVIDER_SITE_OTHER): Payer: PRIVATE HEALTH INSURANCE

## 2019-10-05 DIAGNOSIS — Z1159 Encounter for screening for other viral diseases: Secondary | ICD-10-CM

## 2019-10-05 LAB — SARS CORONAVIRUS 2 (TAT 6-24 HRS): SARS Coronavirus 2: NEGATIVE

## 2019-10-08 ENCOUNTER — Other Ambulatory Visit: Payer: Self-pay

## 2019-10-08 ENCOUNTER — Encounter: Payer: Self-pay | Admitting: Gastroenterology

## 2019-10-08 ENCOUNTER — Ambulatory Visit (AMBULATORY_SURGERY_CENTER): Payer: PRIVATE HEALTH INSURANCE | Admitting: Gastroenterology

## 2019-10-08 VITALS — BP 146/78 | HR 58 | Temp 97.5°F | Resp 17 | Ht 67.0 in | Wt 216.0 lb

## 2019-10-08 DIAGNOSIS — D175 Benign lipomatous neoplasm of intra-abdominal organs: Secondary | ICD-10-CM | POA: Diagnosis not present

## 2019-10-08 DIAGNOSIS — Z1211 Encounter for screening for malignant neoplasm of colon: Secondary | ICD-10-CM

## 2019-10-08 DIAGNOSIS — D124 Benign neoplasm of descending colon: Secondary | ICD-10-CM

## 2019-10-08 MED ORDER — SODIUM CHLORIDE 0.9 % IV SOLN: 500.0000 mL | INTRAVENOUS | Status: DC

## 2019-10-08 NOTE — Progress Notes (Signed)
Vs CW  Pt's states no medical or surgical changes since previsit or office visit.  

## 2019-10-08 NOTE — Progress Notes (Signed)
Called to room to assist during endoscopic procedure.  Patient ID and intended procedure confirmed with present staff. Received instructions for my participation in the procedure from the performing physician.  

## 2019-10-08 NOTE — Progress Notes (Signed)
Pt Drowsy. VSS. To PACU, report to RN. No anesthetic complications noted.  

## 2019-10-08 NOTE — Patient Instructions (Signed)
Information on polyps and diverticulosis given to you today.  Await pathology results.  Resume previous diet and medications.  YOU HAD AN ENDOSCOPIC PROCEDURE TODAY AT THE [facility]:   Refer to the procedure report that was given to you for any specific questions about what was found during the examination.  If the procedure report does not answer your questions, please call your gastroenterologist to clarify.  If you requested that your care partner not be given the details of your procedure findings, then the procedure report has been included in a sealed envelope for you to review at your convenience later.  YOU SHOULD EXPECT: Some feelings of bloating in the abdomen. Passage of more gas than usual.  Walking can help get rid of the air that was put into your GI tract during the procedure and reduce the bloating. If you had a lower endoscopy (such as a colonoscopy or flexible sigmoidoscopy) you may notice spotting of blood in your stool or on the toilet paper. If you underwent a bowel prep for your procedure, you may not have a normal bowel movement for a few days.  Please Note:  You might notice some irritation and congestion in your nose or some drainage.  This is from the oxygen used during your procedure.  There is no need for concern and it should clear up in a day or so.  SYMPTOMS TO REPORT IMMEDIATELY:   Following lower endoscopy (colonoscopy or flexible sigmoidoscopy):  Excessive amounts of blood in the stool  Significant tenderness or worsening of abdominal pains  Swelling of the abdomen that is new, acute  Fever of 100F or higher   For urgent or emergent issues, a gastroenterologist can be reached at any hour by calling [phone]. Do not use MyChart messaging for urgent concerns.    DIET:  We do recommend a small meal at first, but then you may proceed to your regular diet.  Drink plenty of fluids but you should avoid alcoholic beverages for 24  hours.  ACTIVITY:  You should plan to take it easy for the rest of today and you should NOT DRIVE or use heavy machinery until tomorrow (because of the sedation medicines used during the test).    FOLLOW UP: Our staff will call the number listed on your records 48-72 hours following your procedure to check on you and address any questions or concerns that you may have regarding the information given to you following your procedure. If we do not reach you, we will leave a message.  We will attempt to reach you two times.  During this call, we will ask if you have developed any symptoms of COVID 19. If you develop any symptoms (ie: fever, flu-like symptoms, shortness of breath, cough etc.) before then, please call [phone].  If you test positive for Covid 19 in the 2 weeks post procedure, please call and report this information to us.    If any biopsies were taken you will be contacted by phone or by letter within the next 1-3 weeks.  Please call us at [phone] if you have not heard about the biopsies in 3 weeks.    SIGNATURES/CONFIDENTIALITY: You and/or your care partner have signed paperwork which will be entered into your electronic medical record.  These signatures attest to the fact that that the information above on your After Visit Summary has been reviewed and is understood.  Full responsibility of the confidentiality of this discharge information lies with you and/or   your care-partner. 

## 2019-10-08 NOTE — Op Note (Signed)
[place] Patient Name: Benjamin Hodges Procedure Date: 10/08/2019 8:57 AM MRN: [id number] Endoscopist: Milus Banister , MD Age: 54 Referring MD:  Date of Birth: 01/02/1966 Gender: Male Account #: [id number] Procedure:                Colonoscopy Indications:              Screening for colorectal malignant neoplasm Medicines:                Monitored Anesthesia Care Procedure:                Pre-Anesthesia Assessment:                           - Prior to the procedure, a History and Physical                            was performed, and patient medications and                            allergies were reviewed. The patient's tolerance of                            previous anesthesia was also reviewed. The risks                            and benefits of the procedure and the sedation                            options and risks were discussed with the patient.                            All questions were answered, and informed consent                            was obtained. Prior Anticoagulants: The patient has                            taken no previous anticoagulant or antiplatelet                            agents. ASA Grade Assessment: II - A patient with                            mild systemic disease. After reviewing the risks                            and benefits, the patient was deemed in                            satisfactory condition to undergo the procedure.                           After obtaining informed consent, the colonoscope  was passed under direct vision. Throughout the                            procedure, the patient's blood pressure, pulse, and                            oxygen saturations were monitored continuously. The                            Colonoscope was introduced through the anus and                            advanced to the the cecum, identified by                            appendiceal orifice and  ileocecal valve. The                            colonoscopy was performed without difficulty. The                            patient tolerated the procedure well. The quality                            of the bowel preparation was good. The ileocecal                            valve, appendiceal orifice, and rectum were                            photographed. Scope In: 9:13:28 AM Scope Out: 9:25:46 AM Scope Withdrawal Time: 0 hours 9 minutes 15 seconds  Total Procedure Duration: 0 hours 12 minutes 18 seconds  Findings:                 A 3 mm polyp was found in the descending colon. The                            polyp was sessile. The polyp was removed with a                            cold snare. Resection and retrieval were complete.                           Multiple small and large-mouthed diverticula were                            found in the entire colon.                           Small lipoma in the sigmoid colon.                           The exam was otherwise without abnormality on  direct and retroflexion views. Complications:            No immediate complications. Estimated blood loss:                            None. Estimated Blood Loss:     Estimated blood loss: none. Impression:               - One 3 mm polyp in the descending colon, removed                            with a cold snare. Resected and retrieved.                           - Diverticulosis in the entire examined colon.                           - Small lipoma in the sigmoid colon.                           - The examination was otherwise normal on direct                            and retroflexion views. Recommendation:           - Patient has a contact number available for                            emergencies. The signs and symptoms of potential                            delayed complications were discussed with the                            patient. Return to normal  activities tomorrow.                            Written discharge instructions were provided to the                            patient.                           - Resume previous diet.                           - Continue present medications.                           - Await pathology results. Milus Banister, MD 10/08/2019 9:28:08 AM This report has been signed electronically.

## 2019-10-12 ENCOUNTER — Telehealth: Payer: Self-pay | Admitting: *Deleted

## 2019-10-12 ENCOUNTER — Telehealth: Payer: Self-pay

## 2019-10-12 NOTE — Telephone Encounter (Signed)
1st follow up call made.  NALM 

## 2019-10-12 NOTE — Telephone Encounter (Signed)
°  Follow up Call-  Call back number 10/08/2019  Post procedure Call Back phone  # [phone]  Permission to leave phone message Yes  Some recent data might be hidden     Patient questions:  Do you have a fever, pain , or abdominal swelling? No. Pain Score  0 *  Have you tolerated food without any problems? Yes.    Have you been able to return to your normal activities? Yes.    Do you have any questions about your discharge instructions: Diet   No. Medications  No. Follow up visit  No.  Do you have questions or concerns about your Care? No.  Actions: * If pain score is 4 or above: No action needed, pain <4.  1. Have you developed a fever since your procedure? no  2.   Have you had an respiratory symptoms (SOB or cough) since your procedure? no  3.   Have you tested positive for COVID 19 since your procedure no  4.   Have you had any family members/close contacts diagnosed with the COVID 19 since your procedure?  no   If yes to any of these questions please route to Joylene John, RN and Joella Prince, RN

## 2019-10-13 ENCOUNTER — Encounter: Payer: Self-pay | Admitting: Gastroenterology

## 2022-02-07 ENCOUNTER — Other Ambulatory Visit (HOSPITAL_COMMUNITY): Payer: Self-pay | Admitting: Family Medicine

## 2022-02-07 DIAGNOSIS — R0602 Shortness of breath: Secondary | ICD-10-CM

## 2022-02-18 ENCOUNTER — Ambulatory Visit (HOSPITAL_COMMUNITY)
Admission: RE | Admit: 2022-02-18 | Discharge: 2022-02-18 | Disposition: A | Discharge status: Home / Self Care | Payer: No Typology Code available for payment source | Source: Ambulatory Visit | Attending: Cardiology | Admitting: Cardiology

## 2022-02-18 DIAGNOSIS — E785 Hyperlipidemia, unspecified: Secondary | ICD-10-CM | POA: Diagnosis not present

## 2022-02-18 DIAGNOSIS — E079 Disorder of thyroid, unspecified: Secondary | ICD-10-CM | POA: Diagnosis not present

## 2022-02-18 DIAGNOSIS — I10 Essential (primary) hypertension: Secondary | ICD-10-CM | POA: Insufficient documentation

## 2022-02-18 DIAGNOSIS — R0602 Shortness of breath: Secondary | ICD-10-CM | POA: Insufficient documentation

## 2022-02-18 LAB — ECHOCARDIOGRAM COMPLETE
Area-P 1/2: 3.15 cm2
Calc EF: 55.6 %
S' Lateral: 4.2 cm
Single Plane A2C EF: 62.2 %
Single Plane A4C EF: 46 %

## 2022-02-18 NOTE — Progress Notes (Signed)
  Echocardiogram 2D Echocardiogram has been performed.  Darlina Sicilian M 02/18/2022, 8:36 AM
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
## 2022-05-16 ENCOUNTER — Encounter: Payer: Self-pay | Admitting: Cardiology

## 2022-05-16 ENCOUNTER — Ambulatory Visit: Payer: PRIVATE HEALTH INSURANCE | Attending: Cardiology | Admitting: Cardiology

## 2022-05-16 VITALS — BP 134/82 | HR 63 | Ht 69.0 in | Wt 235.2 lb

## 2022-05-16 DIAGNOSIS — I10 Essential (primary) hypertension: Secondary | ICD-10-CM | POA: Diagnosis not present

## 2022-05-16 DIAGNOSIS — R609 Edema, unspecified: Secondary | ICD-10-CM | POA: Diagnosis not present

## 2022-05-16 DIAGNOSIS — R0602 Shortness of breath: Secondary | ICD-10-CM | POA: Diagnosis not present

## 2022-05-16 DIAGNOSIS — E7800 Pure hypercholesterolemia, unspecified: Secondary | ICD-10-CM

## 2022-05-16 MED ORDER — FUROSEMIDE 40 MG PO TABS: 40.0000 mg | ORAL_TABLET | Freq: Every day | ORAL | 3 refills | Status: DC

## 2022-05-16 NOTE — Patient Instructions (Signed)
Medication Instructions:  Continue same medications   Lab Work: None ordered   Testing/Procedures: Schedule Stress Myoview   Follow-Up: At [facility], you and your health needs are our priority.  As part of our continuing mission to provide you with exceptional heart care, we have created designated Provider Care Teams.  These Care Teams include your primary Cardiologist (physician) and Advanced Practice Providers (APPs -  Physician Assistants and Nurse Practitioners) who all work together to provide you with the care you need, when you need it.  We recommend signing up for the patient portal called "MyChart".  Sign up information is provided on this After Visit Summary.  MyChart is used to connect with patients for Virtual Visits (Telemedicine).  Patients are able to view lab/test results, encounter notes, upcoming appointments, etc.  Non-urgent messages can be sent to your provider as well.   To learn more about what you can do with MyChart, go to [email or web address].    Your next appointment:  After test    Provider:  Dr.Jordan

## 2022-05-22 ENCOUNTER — Telehealth (HOSPITAL_COMMUNITY): Payer: Self-pay | Admitting: *Deleted

## 2022-05-22 NOTE — Telephone Encounter (Signed)
Patient given detailed instructions per Myocardial Perfusion Study Information Sheet for the test on 05/27/2022 at 10:00. Patient notified to arrive 15 minutes early and that it is imperative to arrive on time for appointment to keep from having the test rescheduled.  If you need to cancel or reschedule your appointment, please call the office within 24 hours of your appointment. . Patient verbalized understanding.Benjamin Hodges

## 2022-05-27 ENCOUNTER — Ambulatory Visit (HOSPITAL_COMMUNITY): Payer: PRIVATE HEALTH INSURANCE

## 2022-05-27 ENCOUNTER — Encounter (HOSPITAL_COMMUNITY): Payer: Self-pay

## 2022-05-27 ENCOUNTER — Ambulatory Visit (HOSPITAL_COMMUNITY): Payer: No Typology Code available for payment source | Attending: Cardiology

## 2022-05-27 ENCOUNTER — Other Ambulatory Visit (HOSPITAL_COMMUNITY): Payer: Self-pay | Admitting: Cardiology

## 2022-05-27 DIAGNOSIS — E7800 Pure hypercholesterolemia, unspecified: Secondary | ICD-10-CM

## 2022-05-27 DIAGNOSIS — R609 Edema, unspecified: Secondary | ICD-10-CM | POA: Diagnosis present

## 2022-05-27 DIAGNOSIS — I10 Essential (primary) hypertension: Secondary | ICD-10-CM

## 2022-05-27 DIAGNOSIS — R0602 Shortness of breath: Secondary | ICD-10-CM

## 2022-05-27 LAB — MYOCARDIAL PERFUSION IMAGING
LV dias vol: 139 mL (ref 62–150)
LV sys vol: 68 mL
Nuc Stress EF: 51 %
Peak HR: 90 {beats}/min
Peak HR: 90 {beats}/min
Rest HR: 54 {beats}/min
Rest HR: 54 {beats}/min
Rest Nuclear Isotope Dose: 10.4 mCi
Rest Nuclear Isotope Dose: 9.9 mCi
SDS: 0
SRS: 0
SSS: 0
ST Depression (mm): 0 mm
Stress Nuclear Isotope Dose: 31.3 mCi
Stress Nuclear Isotope Dose: 31.8 mCi
TID: 0.88

## 2022-05-27 MED ORDER — TECHNETIUM TC 99M TETROFOSMIN IV KIT
31.8000 | PACK | Freq: Once | INTRAVENOUS | Status: AC | PRN
  Administered 2022-05-27: 31.8 via INTRAVENOUS

## 2022-05-27 MED ORDER — REGADENOSON 0.4 MG/5ML IV SOLN
0.4000 mg | Freq: Once | INTRAVENOUS | Status: AC
  Administered 2022-05-27: 0.4 mg via INTRAVENOUS

## 2022-05-27 MED ORDER — TECHNETIUM TC 99M TETROFOSMIN IV KIT
9.9000 | PACK | Freq: Once | INTRAVENOUS | Status: AC | PRN
  Administered 2022-05-27: 9.9 via INTRAVENOUS

## 2022-06-21 ENCOUNTER — Ambulatory Visit: Payer: PRIVATE HEALTH INSURANCE | Admitting: Cardiology

## 2022-11-06 ENCOUNTER — Ambulatory Visit: Payer: PRIVATE HEALTH INSURANCE | Admitting: Pulmonary Disease

## 2022-11-06 ENCOUNTER — Ambulatory Visit: Payer: PRIVATE HEALTH INSURANCE

## 2022-11-06 ENCOUNTER — Encounter: Payer: Self-pay | Admitting: Pulmonary Disease

## 2022-11-06 VITALS — BP 138/82 | HR 57 | Temp 98.1°F | Ht 69.0 in | Wt 236.6 lb

## 2022-11-06 DIAGNOSIS — R0602 Shortness of breath: Secondary | ICD-10-CM

## 2022-11-06 NOTE — Patient Instructions (Signed)
PFT  Chest x-ray today  Graded exercises as tolerated  After the PFT if there is any significant abnormality, we can consider inhalers  Continue to make sure your blood pressure is well-controlled  Call us with significant concerns
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
## 2023-01-27 ENCOUNTER — Ambulatory Visit: Payer: PRIVATE HEALTH INSURANCE | Admitting: Pulmonary Disease

## 2023-01-27 ENCOUNTER — Encounter (HOSPITAL_BASED_OUTPATIENT_CLINIC_OR_DEPARTMENT_OTHER): Payer: PRIVATE HEALTH INSURANCE

## 2023-07-22 ENCOUNTER — Ambulatory Visit (INDEPENDENT_AMBULATORY_CARE_PROVIDER_SITE_OTHER): Payer: PRIVATE HEALTH INSURANCE | Admitting: Family Medicine

## 2023-07-22 ENCOUNTER — Encounter (HOSPITAL_BASED_OUTPATIENT_CLINIC_OR_DEPARTMENT_OTHER): Payer: Self-pay | Admitting: Family Medicine

## 2023-07-22 VITALS — BP 147/87 | HR 62 | Temp 98.5°F | Resp 16 | Ht 68.11 in | Wt 237.8 lb

## 2023-07-22 DIAGNOSIS — R413 Other amnesia: Secondary | ICD-10-CM | POA: Diagnosis not present

## 2023-07-22 DIAGNOSIS — R5383 Other fatigue: Secondary | ICD-10-CM | POA: Diagnosis not present

## 2023-07-22 DIAGNOSIS — E039 Hypothyroidism, unspecified: Secondary | ICD-10-CM | POA: Diagnosis not present

## 2023-07-22 DIAGNOSIS — Z125 Encounter for screening for malignant neoplasm of prostate: Secondary | ICD-10-CM

## 2023-07-22 NOTE — Progress Notes (Unsigned)
 Established Patient Office Visit  Subjective   Patient ID: Benjamin Hodges, male    DOB: 08-27-1965  Age: 58 y.o. 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  New to my practice.  He states he's had perhaps 6-12 mos of progressively worsening concerns with his memory.  His wife is present and concurs.  His coworkers have mentioned it multiple times.  NOTE HE ALTERNATES HIS THYROID RX AND NEVER TAKES BOTH DOSES THE SAME DAY.  States he had a Colonoscopy with [facility] circa 2023.    Past Medical History:  Diagnosis Date   Depression    GERD (gastroesophageal reflux disease)    High cholesterol    Hypertension    Hypothyroidism    Obesity    OSA on CPAP     Outpatient Encounter Medications as of 07/22/2023  Medication Sig   amLODipine-valsartan (EXFORGE) 10-320 MG tablet Take 1 tablet by mouth daily.   doxazosin (CARDURA) 8 MG tablet Take 4 mg by mouth at bedtime.   FLUoxetine  (PROZAC ) 40 MG capsule Take 40 mg by mouth every morning.   furosemide  (LASIX ) 40 MG tablet Take 1 tablet (40 mg total) by mouth daily.   levothyroxine  (SYNTHROID ) 175 MCG tablet Take 175 mcg by mouth.   levothyroxine  (SYNTHROID , LEVOTHROID) 150 MCG tablet Take 150 mcg by mouth daily before breakfast.   metoprolol  succinate (TOPROL -XL) 100 MG 24 hr tablet Take 150 mg by mouth daily.   Multiple Vitamin (MULTIVITAMIN WITH MINERALS) TABS tablet Take 2 tablets by mouth daily.   rosuvastatin (CRESTOR) 10 MG tablet Take 10 mg by mouth at bedtime.   pravastatin  (PRAVACHOL ) 40 MG tablet Take 40 mg by mouth daily. (Patient not taking: Reported on 07/22/2023)   No facility-administered encounter medications on file as of 07/22/2023.    Social History   Tobacco Use   Smoking status: Former    Current packs/day: 0.00    Average packs/day: 1 pack/day for 8.0 years (8.0 ttl pk-yrs)    Types: Cigarettes, Cigars     Start date: 73    Quit date: 2000    Years since quitting: 25.4   Smokeless tobacco: Never  Vaping Use   Vaping status: Never Used  Substance Use Topics   Alcohol use: Yes    Alcohol/week: 6.0 standard drinks of alcohol    Types: 6 Cans of beer per week   Drug use: No    {History (Optional):23778}  Review of Systems  Constitutional:  Negative for diaphoresis, fever, malaise/fatigue and weight loss.  Respiratory:  Negative for cough, shortness of breath and wheezing.   Cardiovascular:  Negative for chest pain, palpitations, orthopnea, claudication, leg swelling and PND.      Objective:     BP (!) 147/87 (BP Location: Right Arm, Patient Position: Standing, Cuff Size: Normal)   Pulse 62   Temp 98.5 F (36.9 C) (Oral)   Resp 16   Ht 5' 8.11 (1.73 m)   Wt 237 lb 12.8 oz (107.9 kg)   SpO2 95%   BMI 36.04 kg/m  {Vitals History (Optional):23777}  Physical Exam Constitutional:      General: He is not in acute distress.    Appearance: Normal appearance.  HENT:     Head: Normocephalic.  Neck:     Vascular: No carotid bruit.   Cardiovascular:  Rate and Rhythm: Normal rate and regular rhythm.     Pulses: Normal pulses.     Heart sounds: Normal heart sounds.  Pulmonary:     Effort: Pulmonary effort is normal.     Breath sounds: Normal breath sounds.  Abdominal:     General: Bowel sounds are normal.     Palpations: Abdomen is soft.   Musculoskeletal:     Cervical back: Neck supple. No tenderness.     Right lower leg: No edema.     Left lower leg: No edema.   Neurological:     Mental Status: He is alert.      No results found for any visits on 07/22/23.  {Labs (Optional):23779}  The ASCVD Risk score (Arnett DK, et al., 2019) failed to calculate for the following reasons:   Cannot find a previous HDL lab   Cannot find a previous total cholesterol lab    Assessment & Plan:  Memory disturbance -     Ambulatory referral to Neurology  Fatigue,  unspecified type -     CBC with Differential/Platelet -     Comprehensive metabolic panel with GFR -     Vitamin B12  Prostate cancer screening -     PSA  Acquired hypothyroidism -     TSH    Return in about 3 months (around 10/22/2023) for chronic follow-up.    REDDING PONCE NORLEEN FALCON., MD

## 2023-07-23 ENCOUNTER — Ambulatory Visit (HOSPITAL_BASED_OUTPATIENT_CLINIC_OR_DEPARTMENT_OTHER): Payer: Self-pay | Admitting: Family Medicine

## 2023-07-23 LAB — CBC WITH DIFFERENTIAL/PLATELET
Basophils Absolute: 0 10*3/uL (ref 0.0–0.2)
Basos: 0 %
EOS (ABSOLUTE): 0.2 10*3/uL (ref 0.0–0.4)
Eos: 2 %
Hematocrit: 41.8 % (ref 37.5–51.0)
Hemoglobin: 14 g/dL (ref 13.0–17.7)
Immature Grans (Abs): 0 10*3/uL (ref 0.0–0.1)
Immature Granulocytes: 0 %
Lymphocytes Absolute: 2.4 10*3/uL (ref 0.7–3.1)
Lymphs: 28 %
MCH: 29.1 pg (ref 26.6–33.0)
MCHC: 33.5 g/dL (ref 31.5–35.7)
MCV: 87 fL (ref 79–97)
Monocytes Absolute: 0.8 10*3/uL (ref 0.1–0.9)
Monocytes: 10 %
Neutrophils Absolute: 5 10*3/uL (ref 1.4–7.0)
Neutrophils: 60 %
Platelets: 180 10*3/uL (ref 150–450)
RBC: 4.81 x10E6/uL (ref 4.14–5.80)
RDW: 13.6 % (ref 11.6–15.4)
WBC: 8.4 10*3/uL (ref 3.4–10.8)

## 2023-07-23 LAB — COMPREHENSIVE METABOLIC PANEL WITH GFR
ALT: 37 IU/L (ref 0–44)
AST: 24 IU/L (ref 0–40)
Albumin: 4.6 g/dL (ref 3.8–4.9)
Alkaline Phosphatase: 79 IU/L (ref 44–121)
BUN/Creatinine Ratio: 18 (ref 9–20)
BUN: 19 mg/dL (ref 6–24)
Bilirubin Total: 0.4 mg/dL (ref 0.0–1.2)
CO2: 22 mmol/L (ref 20–29)
Calcium: 9.5 mg/dL (ref 8.7–10.2)
Chloride: 108 mmol/L — ABNORMAL HIGH (ref 96–106)
Creatinine, Ser: 1.08 mg/dL (ref 0.76–1.27)
Globulin, Total: 2.5 g/dL (ref 1.5–4.5)
Glucose: 85 mg/dL (ref 70–99)
Potassium: 4 mmol/L (ref 3.5–5.2)
Sodium: 147 mmol/L — ABNORMAL HIGH (ref 134–144)
Total Protein: 7.1 g/dL (ref 6.0–8.5)
eGFR: 81 mL/min/{1.73_m2} (ref >59)

## 2023-07-23 LAB — VITAMIN B12: Vitamin B-12: 487 pg/mL (ref 232–1245)

## 2023-07-23 LAB — TSH: TSH: 2.06 u[IU]/mL (ref 0.450–4.500)

## 2023-07-23 LAB — PSA: Prostate Specific Ag, Serum: 0.7 ng/mL (ref 0.0–4.0)

## 2023-07-23 NOTE — Assessment & Plan Note (Signed)
 Initiate workup.  His depression seems well controlled.  Defer Head imaging to Neurology given likely insurance barriers.  He assures me that his home BP is controlled.  F/u in a few months to address other issues.

## 2023-08-04 ENCOUNTER — Ambulatory Visit: Payer: Self-pay

## 2023-08-04 NOTE — Telephone Encounter (Signed)
  PCP Dr Dottie- last office visit 07/22/23. Symptoms: episode on Saturday lasted 1.5-2 hours of feeling foggy headed, sleepy and sensitive to loud noises, pressure in head. Resolved and not present at this time. Patient also adds yesterday he felt moody and overcome with his emotions.   Patient refused acute visit with PCP and would like to know what PCP recommends.                    Copied from CRM [id number]. Topic: Clinical - Red Word Triage >> Aug 04, 2023  1:34 PM Powell HERO wrote: Red Word that prompted transfer to Nurse Triage: Patient states over the weekend he got very confused and disoriented and became sensitive to very loud noises. Very worried as this keeps happening and he is not able to see Neurology until October. Answer Assessment - Initial Assessment Questions Patient states he followed up with neurology, he states [facility] can not see him until October 20th. Offered patient acute visit with PCP, declined. Patient would like to send a message to his PCP and go based off his recommendations and wanted PCP to be informed about recent episode. Patient also wanted to add that yesterday he felt like he felt really moody and didn't have any reason to be mad/sad but felt overcome with his mood.  1. SYMPTOM: What is the main symptom you are concerned about? (e.g., weakness, numbness)     He describes it as feeling sleepy, foggy headed (hard to focus) and sensitive to loud noises.  2. ONSET: When did this start? (minutes, hours, days; while sleeping)     Last episode was on Saturday, lasted bout 1.5-2 hours.  3. LAST NORMAL: When was the last time you (the patient) were normal (no symptoms)?     He has been having these episodes on and off, in the beginning he states it was memory problems. X 3-4 months.  4. PATTERN Does this come and go, or has it been constant since it started?  Is it present now?     Comes and goes. He is currently back to  baseline/normal.  5. CARDIAC SYMPTOMS: Have you had any of the following symptoms: chest pain, difficulty breathing, palpitations?     No.  6. NEUROLOGIC SYMPTOMS: Have you had any of the following symptoms: headache, dizziness, vision loss, double vision, changes in speech, unsteady on your feet?     Patient denies unilateral numbness or weakness, changes in speech or vision, dizziness. He states when the episodes happen it feels like pressure.  7. OTHER SYMPTOMS: Do you have any other symptoms?     No.  8. PREGNANCY: Is there any chance you are pregnant? When was your last menstrual period?     N/A.  Protocols used: Neurologic Deficit-A-AH

## 2023-09-09 ENCOUNTER — Other Ambulatory Visit (HOSPITAL_BASED_OUTPATIENT_CLINIC_OR_DEPARTMENT_OTHER): Payer: Self-pay | Admitting: Family Medicine

## 2023-09-09 NOTE — Telephone Encounter (Signed)
 Copied from CRM [id number]. Topic: Clinical - Medication Refill >> Sep 09, 2023  4:55 PM Leah C wrote: Medication: amLODipine -valsartan  (EXFORGE ) 10-320 MG tablet, doxazosin  (CARDURA ) 8 MG tablet [[id number]], FLUoxetine  (PROZAC ) 40 MG capsule [[id number]], furosemide  (LASIX ) 40 MG tablet [[id number]] ENDED, levothyroxine  (SYNTHROID ) 175 MCG tablet [[id number]], levothyroxine  (SYNTHROID , LEVOTHROID) 150 MCG tablet [[id number]], metoprolol  succinate (TOPROL -XL) 100 MG 24 hr tablet [[id number]], Multiple Vitamin (MULTIVITAMIN WITH MINERALS) TABS tablet [[id number]], pravastatin  (PRAVACHOL ) 40 MG tablet [[id number]], rosuvastatin  (CRESTOR ) 10 MG tablet [[id number]   Has the patient contacted their pharmacy? Patient called and stated that he put in for refills and when he went to [facility], they stated that they did not receive any scripts because they are still under his old provider. [facility] told patient that our clinic would need to send over new scripts with Dr. Briant name for all his medications.    This is the patient's preferred pharmacy:  [facility] [address], [place] - [address] [facility] [place] [address] Phone: [phone] Fax: [phone]  Is this the correct pharmacy for this prescription? Yes If no, delete pharmacy and type the correct one.   Has the prescription been filled recently? No  Is the patient out of the medication? Yes  Has the patient been seen for an appointment in the last year OR does the patient have an upcoming appointment? Yes  Can we respond through MyChart? Yes  Agent: Please be advised that Rx refills may take up to 3 business days. We ask that you follow-up with your pharmacy.

## 2023-09-12 ENCOUNTER — Other Ambulatory Visit (HOSPITAL_BASED_OUTPATIENT_CLINIC_OR_DEPARTMENT_OTHER): Payer: Self-pay | Admitting: Family Medicine

## 2023-09-12 MED ORDER — LEVOTHYROXINE SODIUM 175 MCG PO TABS: 175.0000 ug | ORAL_TABLET | Freq: Every day | ORAL | 0 refills | Status: DC

## 2023-09-12 MED ORDER — FUROSEMIDE 40 MG PO TABS: 40.0000 mg | ORAL_TABLET | Freq: Every day | ORAL | Status: DC

## 2023-09-12 MED ORDER — METOPROLOL SUCCINATE ER 100 MG PO TB24: 150.0000 mg | ORAL_TABLET | Freq: Every day | ORAL | 0 refills | Status: DC

## 2023-09-12 MED ORDER — ROSUVASTATIN CALCIUM 10 MG PO TABS: 10.0000 mg | ORAL_TABLET | Freq: Every day | ORAL | 0 refills | Status: DC

## 2023-09-12 MED ORDER — LEVOTHYROXINE SODIUM 150 MCG PO TABS: 150.0000 ug | ORAL_TABLET | Freq: Every day | ORAL | 0 refills | Status: DC

## 2023-09-12 MED ORDER — DOXAZOSIN MESYLATE 8 MG PO TABS: 4.0000 mg | ORAL_TABLET | Freq: Every day | ORAL | 0 refills | Status: AC

## 2023-09-12 MED ORDER — FLUOXETINE HCL 40 MG PO CAPS: 40.0000 mg | ORAL_CAPSULE | Freq: Every morning | ORAL | 0 refills | Status: AC

## 2023-09-12 MED ORDER — AMLODIPINE BESYLATE-VALSARTAN 10-320 MG PO TABS: 1.0000 | ORAL_TABLET | Freq: Every day | ORAL | 0 refills | Status: DC

## 2023-10-23 ENCOUNTER — Ambulatory Visit (HOSPITAL_BASED_OUTPATIENT_CLINIC_OR_DEPARTMENT_OTHER): Payer: PRIVATE HEALTH INSURANCE | Admitting: Family Medicine

## 2023-10-23 ENCOUNTER — Encounter (HOSPITAL_BASED_OUTPATIENT_CLINIC_OR_DEPARTMENT_OTHER): Payer: Self-pay | Admitting: Family Medicine

## 2023-10-23 VITALS — BP 148/79 | HR 59 | Temp 98.3°F | Resp 16 | Wt 237.0 lb

## 2023-10-23 DIAGNOSIS — Z1211 Encounter for screening for malignant neoplasm of colon: Secondary | ICD-10-CM | POA: Insufficient documentation

## 2023-10-23 DIAGNOSIS — E039 Hypothyroidism, unspecified: Secondary | ICD-10-CM

## 2023-10-23 DIAGNOSIS — R413 Other amnesia: Secondary | ICD-10-CM | POA: Diagnosis not present

## 2023-10-23 DIAGNOSIS — I10 Essential (primary) hypertension: Secondary | ICD-10-CM | POA: Diagnosis not present

## 2023-10-23 DIAGNOSIS — G4733 Obstructive sleep apnea (adult) (pediatric): Secondary | ICD-10-CM | POA: Insufficient documentation

## 2023-10-23 DIAGNOSIS — G479 Sleep disorder, unspecified: Secondary | ICD-10-CM | POA: Diagnosis not present

## 2023-10-23 NOTE — Progress Notes (Signed)
 Established Patient Office Visit  Subjective   Patient ID: Benjamin Hodges, male    DOB: 02-10-1965  Age: 58 y.o. MRN: [id number]  Chief Complaint  Patient presents with   Follow-up    Follow-up     F/u as above.  Please see last note for details.  He has an upcoming appt with Neurology.  Home BP is controlled most of the time, at 130/80.  Since his last visit, he reports perhaps two episodes of zoning out, where he felt somewhat drugged for some minutes (up to an hour).  He states it was like he'd had 2-3 beers w/o any alcohol.  Denies any slurred speech.  His OSA seems relatively well controlled, though he admits occasional naps.  He occasionally feels sleepy if he sits for a while.  No sleep attacks.    Past Medical History:  Diagnosis Date   Depression    Former smoker    GERD (gastroesophageal reflux disease)    High cholesterol    Hypertension    Hypothyroidism    Obesity    OSA on CPAP     Outpatient Encounter Medications as of 10/23/2023  Medication Sig   amLODipine -valsartan  (EXFORGE ) 10-320 MG tablet Take 1 tablet by mouth daily.   doxazosin  (CARDURA ) 8 MG tablet Take 0.5 tablets (4 mg total) by mouth at bedtime.   FLUoxetine  (PROZAC ) 40 MG capsule Take 1 capsule (40 mg total) by mouth every morning.   levothyroxine  (SYNTHROID ) 150 MCG tablet Take 1 tablet (150 mcg total) by mouth daily.   levothyroxine  (SYNTHROID ) 175 MCG tablet Take 1 tablet (175 mcg total) by mouth daily before breakfast for 1 dose.   metoprolol  succinate (TOPROL -XL) 100 MG 24 hr tablet Take 1.5 tablets (150 mg total) by mouth daily.   rosuvastatin  (CRESTOR ) 10 MG tablet Take 1 tablet (10 mg total) by mouth at bedtime.   [DISCONTINUED] furosemide  (LASIX ) 40 MG tablet Take 1 tablet (40 mg total) by mouth daily.   No facility-administered encounter medications on file as of 10/23/2023.    Social History   Tobacco Use   Smoking status: Former    Current packs/day: 0.00    Average packs/day:  1 pack/day for 8.0 years (8.0 ttl pk-yrs)    Types: Cigarettes, Cigars    Start date: 76    Quit date: 2000    Years since quitting: 25.7   Smokeless tobacco: Never  Vaping Use   Vaping status: Never Used  Substance Use Topics   Alcohol use: Yes    Alcohol/week: 6.0 standard drinks of alcohol    Types: 6 Cans of beer per week   Drug use: No      Review of Systems  Constitutional:  Negative for fever and malaise/fatigue.  HENT:  Negative for hearing loss.   Cardiovascular:  Negative for chest pain.  Gastrointestinal:  Negative for abdominal pain.  Skin:  Negative for rash.  Neurological:  Negative for dizziness, tingling, tremors, sensory change, focal weakness, seizures and loss of consciousness.  Psychiatric/Behavioral:  Negative for depression, hallucinations and memory loss. The patient does not have insomnia.       Objective:     BP (!) 148/79   Pulse (!) 59   Temp 98.3 F (36.8 C) (Oral)   Resp 16   Wt 237 lb (107.5 kg)   SpO2 96%   BMI 35.92 kg/m    Physical Exam Constitutional:      General: He is not in acute distress.  Appearance: Normal appearance. He is obese.  HENT:     Head: Normocephalic.  Neck:     Vascular: No carotid bruit.  Cardiovascular:     Rate and Rhythm: Normal rate and regular rhythm.     Pulses: Normal pulses.     Heart sounds: Normal heart sounds.  Pulmonary:     Effort: Pulmonary effort is normal.     Breath sounds: Normal breath sounds.  Abdominal:     General: Bowel sounds are normal.     Palpations: Abdomen is soft.  Musculoskeletal:     Cervical back: Neck supple. No tenderness.     Right lower leg: No edema.     Left lower leg: No edema.  Neurological:     General: No focal deficit present.     Mental Status: He is alert.      No results found for any visits on 10/23/23.    The ASCVD Risk score (Arnett DK, et al., 2019) failed to calculate for the following reasons:   Cannot find a previous HDL lab    Cannot find a previous total cholesterol lab    Assessment & Plan:  Memory disturbance Assessment & Plan: Unclear etiology.  Proceed now with a priority Head scan.  Upcoming Neurology visit in about 2 weeks.  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  Will refer him to  sleep.  I discouraged him driving long distances in the meantime.  Orders: -     Ambulatory referral to Sleep Studies  OSA on CPAP -     Ambulatory referral to Sleep Studies  Primary hypertension Assessment & Plan: DASH diet.  Try to lose some additional weight.  If I get reports in the coming weeks of uncontrolled BP, I advised a secondary HTN workup.  This was naturally reviewed in lay terms and he is in agreement.   Acquired hypothyroidism Assessment & Plan: He takes his two separate Synthroid  dosages in alternating fashion per his previous PCP.  I suggested he consider consolidating this and he will think about it.   Colon cancer screening Assessment & Plan: Review this soon, after his more pressing issues settle down.     Return in about 3 months (around 01/22/2024) for chronic follow-up.    REDDING PONCE NORLEEN FALCON., MD

## 2023-10-23 NOTE — Assessment & Plan Note (Signed)
 Unclear etiology.  Proceed now with a priority Head scan.  Upcoming Neurology visit in about 2 weeks.

## 2023-10-23 NOTE — Assessment & Plan Note (Signed)
 Review this soon, after his more pressing issues settle down.

## 2023-10-23 NOTE — Assessment & Plan Note (Signed)
 He takes his two separate Synthroid  dosages in alternating fashion per his previous PCP.  I suggested he consider consolidating this and he will think about it.

## 2023-10-23 NOTE — Assessment & Plan Note (Signed)
 DASH diet.  Try to lose some additional weight.  If I get reports in the coming weeks of uncontrolled BP, I advised a secondary HTN workup.  This was naturally reviewed in lay terms and he is in agreement.

## 2023-10-23 NOTE — Assessment & Plan Note (Addendum)
 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  Will refer him to [facility].  I discouraged him driving long distances in the meantime.

## 2023-10-24 ENCOUNTER — Encounter (HOSPITAL_BASED_OUTPATIENT_CLINIC_OR_DEPARTMENT_OTHER): Payer: Self-pay

## 2023-10-24 ENCOUNTER — Ambulatory Visit (HOSPITAL_BASED_OUTPATIENT_CLINIC_OR_DEPARTMENT_OTHER)
Admission: RE | Admit: 2023-10-24 | Discharge: 2023-10-24 | Disposition: A | Discharge status: Home / Self Care | Source: Ambulatory Visit | Attending: Family Medicine | Admitting: Family Medicine

## 2023-10-24 DIAGNOSIS — R413 Other amnesia: Secondary | ICD-10-CM

## 2023-10-24 DIAGNOSIS — R4182 Altered mental status, unspecified: Secondary | ICD-10-CM | POA: Diagnosis not present

## 2023-10-27 ENCOUNTER — Encounter (HOSPITAL_BASED_OUTPATIENT_CLINIC_OR_DEPARTMENT_OTHER): Payer: Self-pay | Admitting: *Deleted

## 2023-10-27 ENCOUNTER — Ambulatory Visit (HOSPITAL_BASED_OUTPATIENT_CLINIC_OR_DEPARTMENT_OTHER): Payer: Self-pay | Admitting: Family Medicine

## 2023-11-17 ENCOUNTER — Ambulatory Visit: Payer: PRIVATE HEALTH INSURANCE | Admitting: Neurology

## 2023-11-17 ENCOUNTER — Encounter: Payer: Self-pay | Admitting: Neurology

## 2023-11-17 VITALS — BP 164/110 | HR 65 | Resp 17 | Ht 69.0 in

## 2023-11-17 DIAGNOSIS — G43009 Migraine without aura, not intractable, without status migrainosus: Secondary | ICD-10-CM | POA: Diagnosis not present

## 2023-11-17 DIAGNOSIS — R404 Transient alteration of awareness: Secondary | ICD-10-CM

## 2023-11-17 MED ORDER — SUMATRIPTAN SUCCINATE 100 MG PO TABS
100.0000 mg | ORAL_TABLET | Freq: Once | ORAL | 0 refills | Status: DC | PRN
Start: 2023-11-17 — End: 2023-12-18

## 2023-11-17 NOTE — Patient Instructions (Signed)
 Routine EEG, I will contact you to go over the result Continue your current medications Sumatriptan at the onset of this episode Give patient a trial of Nurtec Follow-up in 6 to 8 months or sooner if worse

## 2023-11-17 NOTE — Progress Notes (Signed)
 [facility]  PATIENT: Benjamin Hodges DOB: 1965-05-21  REQUESTING CLINICIAN: Dottie Norleen PHEBE PONCE, MD HISTORY FROM: Patient and spouse  REASON FOR VISIT: Headaches/ Episode of altered mental status    HISTORICAL  CHIEF COMPLAINT:  Chief Complaint  Patient presents with   New Patient (Initial Visit)    Rm13, wife present, Memory: moca score of 25    HISTORY OF PRESENT ILLNESS:  This is a 58 year old gentleman past medical history hypertension, hyperlipidemia, sleep apnea on CPAP, anxiety who is presenting with episodes of altered mental status.  He tells me during these episodes, he has a feeling of head rush where he experiences brain fog, able to hear but unable to answer, followed by headaches.  He would take over-the-counter Tylenol  or Aleve with improvement of the headaches.  He will have an average 1 episode per week. When it comes to his memory problem, he tells me sometimes he can walk into place and forget the reason why he came there but otherwise he is independent in all ADLs.  His main issue today is the this episode of head rush, associated with little bit of altered mental status.   TBI:   No past history of TBI Stroke:  no past history of stroke Seizures:   no past history of seizures Sleep: yes on CPAP Mood:   patient denies anxiety and depression Family history of Dementia:   Denies  Functional status: independent in all ADLs and IADLs Patient lives with family. Cooking: no issues  Cleaning: no issues  Shopping: no issues  Bathing: no issues  Toileting: no issues Driving: no issues  Bills: no issues  Medications: no issues  Ever left the stove on by accident?: denies Forget how to use items around the house?: denies Getting lost going to familiar places?: denies Forgetting loved ones names?: denies Word finding difficulty? denies Sleep: good with his CPAP machine    OTHER MEDICAL CONDITIONS: Sleep apnea, Hypertension,  Hyperlipidemia   REVIEW OF SYSTEMS: Full 14 system review of systems performed and negative with exception of: As noted in the HPI   ALLERGIES: Allergies  Allergen Reactions   Niacin And Related Hives    HOME MEDICATIONS: Outpatient Medications Prior to Visit  Medication Sig Dispense Refill   amLODipine -valsartan  (EXFORGE ) 10-320 MG tablet Take 1 tablet by mouth daily. 90 tablet 0   doxazosin  (CARDURA ) 8 MG tablet Take 0.5 tablets (4 mg total) by mouth at bedtime. 90 tablet 0   FLUoxetine  (PROZAC ) 40 MG capsule Take 1 capsule (40 mg total) by mouth every morning. 90 capsule 0   levothyroxine  (SYNTHROID ) 150 MCG tablet Take 1 tablet (150 mcg total) by mouth daily. 90 tablet 0   levothyroxine  (SYNTHROID ) 175 MCG tablet Take 1 tablet (175 mcg total) by mouth daily before breakfast for 1 dose. 90 tablet 0   metoprolol  succinate (TOPROL -XL) 100 MG 24 hr tablet Take 1.5 tablets (150 mg total) by mouth daily. 90 tablet 0   rosuvastatin  (CRESTOR ) 10 MG tablet Take 1 tablet (10 mg total) by mouth at bedtime. 90 tablet 0   No facility-administered medications prior to visit.    PAST MEDICAL HISTORY: Past Medical History:  Diagnosis Date   Depression    Former smoker    GERD (gastroesophageal reflux disease)    High cholesterol    Hypertension    Hypothyroidism    Obesity    OSA on CPAP     PAST SURGICAL HISTORY: Past Surgical History:  Procedure Laterality  Date   INCISION AND DRAINAGE ABSCESS N/A 04/25/2016   Procedure: INCISION AND DRAINAGE SCROTAL ABSCESS;  Surgeon: Glendia Elizabeth, MD;  Location: [facility];  Service: Urology;  Laterality: N/A;   moles removed     ORCHIECTOMY Right 04/25/2016   Procedure: RIGHT ORCHIECTOMY;  Surgeon: Glendia Elizabeth, MD;  Location: [facility];  Service: Urology;  Laterality: Right;    FAMILY HISTORY: Family History  Problem Relation Age of Onset   Hodgkin's lymphoma Mother    Heart failure Father 15   Heart disease Father    Colon cancer Neg Hx     Rectal cancer Neg Hx    Stomach cancer Neg Hx     SOCIAL HISTORY: Social History   Socioeconomic History   Marital status: Married    Spouse name: Not on file   Number of children: 2   Years of education: Not on file   Highest education level: Not on file  Occupational History   Not on file  Tobacco Use   Smoking status: Former    Current packs/day: 0.00    Average packs/day: 1 pack/day for 8.0 years (8.0 ttl pk-yrs)    Types: Cigarettes, Cigars    Start date: 71    Quit date: 2000    Years since quitting: 25.8   Smokeless tobacco: Never  Vaping Use   Vaping status: Never Used  Substance and Sexual Activity   Alcohol use: Yes    Alcohol/week: 6.0 standard drinks of alcohol    Types: 6 Cans of beer per week   Drug use: No   Sexual activity: Yes  Other Topics Concern   Not on file  Social History Narrative   electrician   Social Drivers of Health   Financial Resource Strain: Low Risk  (07/22/2023)   Overall Financial Resource Strain (CARDIA)    Difficulty of Paying Living Expenses: Not very hard  Food Insecurity: No Food Insecurity (07/22/2023)   Hunger Vital Sign    Worried About Running Out of Food in the Last Year: Never true    Ran Out of Food in the Last Year: Never true  Transportation Needs: No Transportation Needs (07/22/2023)   PRAPARE - Administrator, Civil Service (Medical): No    Lack of Transportation (Non-Medical): No  Physical Activity: Inactive (07/22/2023)   Exercise Vital Sign    Days of Exercise per Week: 0 days    Minutes of Exercise per Session: 0 min  Stress: No Stress Concern Present (07/22/2023)   [employer] of Occupational Health - Occupational Stress Questionnaire    Feeling of Stress: Only a little  Social Connections: Not on file  Intimate Partner Violence: Not At Risk (07/22/2023)   Humiliation, Afraid, Rape, and Kick questionnaire    Fear of Current or Ex-Partner: No    Emotionally Abused: No    Physically  Abused: No    Sexually Abused: No    PHYSICAL EXAM  GENERAL EXAM/CONSTITUTIONAL: Vitals:  Vitals:   11/17/23 1502 11/17/23 1522  BP: (!) 170/105 (!) 164/110  Pulse: 65   Resp: 17   SpO2: 98%   Height: 5' 9 (1.753 m)    Body mass index is 35 kg/m. Wt Readings from Last 3 Encounters:  10/23/23 237 lb (107.5 kg)  07/22/23 237 lb 12.8 oz (107.9 kg)  11/06/22 236 lb 9.6 oz (107.3 kg)   Patient is in no distress; well developed, nourished and groomed; neck is supple  MUSCULOSKELETAL: Gait, strength, tone, movements noted in  Neurologic exam below  NEUROLOGIC: MENTAL STATUS:      No data to display            11/17/2023    3:12 PM  Montreal Cognitive Assessment   Visuospatial/ Executive (0/5) 4  Naming (0/3) 3  Attention: Read list of digits (0/2) 2  Attention: Read list of letters (0/1) 1  Attention: Serial 7 subtraction starting at 100 (0/3) 3  Language: Repeat phrase (0/2) 2  Language : Fluency (0/1) 1  Abstraction (0/2) 2  Delayed Recall (0/5) 1  Orientation (0/6) 6  Total 25   awake, alert, oriented to person, place and time recent and remote memory intact normal attention and concentration language fluent, comprehension intact, naming intact fund of knowledge appropriate  CRANIAL NERVE:  2nd, 3rd, 4th, 6th- visual fields full to confrontation, extraocular muscles intact, no nystagmus 5th - facial sensation symmetric 7th - facial strength symmetric 8th - hearing intact 9th - palate elevates symmetrically, uvula midline 11th - shoulder shrug symmetric 12th - tongue protrusion midline  MOTOR:  normal bulk and tone, full strength in the BUE, BLE  SENSORY:  normal and symmetric to light touch  COORDINATION:  finger-nose-finger, fine finger movements normal  GAIT/STATION:  normal   DIAGNOSTIC DATA (LABS, IMAGING, TESTING) - I reviewed patient records, labs, notes, testing and imaging myself where available.  Lab Results  Component Value  Date   WBC 8.4 07/22/2023   HGB 14.0 07/22/2023   HCT 41.8 07/22/2023   MCV 87 07/22/2023   PLT 180 07/22/2023      Component Value Date/Time   NA 147 (H) 07/22/2023 1648   K 4.0 07/22/2023 1648   CL 108 (H) 07/22/2023 1648   CO2 22 07/22/2023 1648   GLUCOSE 85 07/22/2023 1648   GLUCOSE 126 (H) 04/14/2016 1141   BUN 19 07/22/2023 1648   CREATININE 1.08 07/22/2023 1648   CALCIUM  9.5 07/22/2023 1648   PROT 7.1 07/22/2023 1648   ALBUMIN 4.6 07/22/2023 1648   AST 24 07/22/2023 1648   ALT 37 07/22/2023 1648   ALKPHOS 79 07/22/2023 1648   BILITOT 0.4 07/22/2023 1648   GFRNONAA >60 04/14/2016 1141   GFRAA >60 04/14/2016 1141   No results found for: CHOL, HDL, LDLCALC, LDLDIRECT, TRIG, CHOLHDL No results found for: YHAJ8R Lab Results  Component Value Date   VITAMINB12 487 07/22/2023   Lab Results  Component Value Date   TSH 2.060 07/22/2023    CT Head 10/24/2023 1. No acute intracranial abnormality. 2. Mild left maxillary sinus disease.    ASSESSMENT AND PLAN  58 y.o. year old male with history of hypertension, hyperlipidemia, hypothyroidism, anxiety who is presenting with episodes of head rush, difficulty with communication followed by headache.  Based on this presentation, I do suspect migraine.  Seizure is also on the differential.  Will obtain routine EEG and I will also start patient on sumatriptan to use at the onset of the episode.  Will give him a trial of Nurtec.  I will contact him to go over the results of the EEG otherwise I will see him in 6 to 8 months for follow-up or sooner if worse   1. Transient alteration of awareness   2. Migraine without aura and without status migrainosus, not intractable      Patient Instructions  Routine EEG, I will contact you to go over the result Continue your current medications Sumatriptan at the onset of this episode Give patient a trial of Nurtec Follow-up  in 6 to 8 months or sooner if worse  Orders  Placed This Encounter  Procedures   EEG adult    Meds ordered this encounter  Medications   SUMAtriptan (IMITREX) 100 MG tablet    Sig: Take 1 tablet (100 mg total) by mouth once as needed for up to 1 dose for migraine. May repeat in 2 hours if headache persists or recurs.    Dispense:  10 tablet    Refill:  0    Return in about 6 months (around 05/17/2024).    Pastor Falling, MD 11/17/2023, 5:48 PM  [facility] [address] [place], [place] [address] [phone]

## 2023-11-23 ENCOUNTER — Other Ambulatory Visit (HOSPITAL_BASED_OUTPATIENT_CLINIC_OR_DEPARTMENT_OTHER): Payer: Self-pay | Admitting: Family Medicine

## 2023-12-01 ENCOUNTER — Ambulatory Visit: Payer: Self-pay | Admitting: Neurology

## 2023-12-01 ENCOUNTER — Ambulatory Visit: Admitting: Neurology

## 2023-12-01 DIAGNOSIS — R4182 Altered mental status, unspecified: Secondary | ICD-10-CM | POA: Diagnosis not present

## 2023-12-01 DIAGNOSIS — R404 Transient alteration of awareness: Secondary | ICD-10-CM

## 2023-12-01 NOTE — Procedures (Signed)
   History:  58 year old man with transient alteration of awareness   EEG classification:  Awake and asleep  Duration: 28 minutes   Technical aspects: This EEG study was done with scalp electrodes positioned according to the 10-20 International system of electrode placement. Electrical activity was reviewed with band pass filter of 1-70Hz , sensitivity of 7 uV/mm, display speed of 93mm/sec with a 60Hz  notched filter applied as appropriate. EEG data were recorded continuously and digitally stored.   Description of the recording: The background rhythms of this recording consists of a fairly well modulated medium amplitude background activity of 9-10 Hz. As the record progresses, the patient initially is in the waking state, but appears to enter the early stage II sleep during the recording, with rudimentary sleep spindles and vertex sharp wave activity seen. During the wakeful state, photic stimulation was performed, and no abnormal responses were seen. Hyperventilation was also performed, no abnormal response seen. No epileptiform discharges seen during this recording. There was no focal slowing.   Abnormality: None   Impression: This is a normal awake and sleep EEG. No evidence of interictal epileptiform discharges. Normal EEGs, however, do not rule out epilepsy.    Sherlie Boyum, MD [facility]

## 2023-12-05 ENCOUNTER — Other Ambulatory Visit (HOSPITAL_BASED_OUTPATIENT_CLINIC_OR_DEPARTMENT_OTHER): Payer: Self-pay | Admitting: Family Medicine

## 2023-12-05 DIAGNOSIS — I10 Essential (primary) hypertension: Secondary | ICD-10-CM

## 2023-12-05 NOTE — Telephone Encounter (Signed)
 Copied from CRM [id number]. Topic: Clinical - Medication Refill >> Dec 05, 2023  9:05 AM Brittany M wrote: Medication: metoprolol  succinate (TOPROL -XL) 100 MG 24 hr tablet  Has the patient contacted their pharmacy? Yes (Agent: If no, request that the patient contact the pharmacy for the refill. If patient does not wish to contact the pharmacy document the reason why and proceed with request.) (Agent: If yes, when and what did the pharmacy advise?)  This is the patient's preferred pharmacy:  [facility] [address], [place] - [address] [facility] [place] [address] Phone: [phone] Fax: [phone]  Is this the correct pharmacy for this prescription? Yes If no, delete pharmacy and type the correct one.   Has the prescription been filled recently? Yes  Is the patient out of the medication? Yes  Has the patient been seen for an appointment in the last year OR does the patient have an upcoming appointment? Yes  Can we respond through MyChart? Yes  Agent: Please be advised that Rx refills may take up to 3 business days. We ask that you follow-up with your pharmacy.

## 2023-12-08 MED ORDER — METOPROLOL SUCCINATE ER 100 MG PO TB24: 150.0000 mg | ORAL_TABLET | Freq: Every day | ORAL | 1 refills | Status: DC

## 2023-12-18 ENCOUNTER — Other Ambulatory Visit: Payer: Self-pay | Admitting: Neurology

## 2023-12-29 ENCOUNTER — Other Ambulatory Visit (HOSPITAL_BASED_OUTPATIENT_CLINIC_OR_DEPARTMENT_OTHER): Payer: Self-pay | Admitting: Family Medicine

## 2024-01-02 ENCOUNTER — Other Ambulatory Visit (HOSPITAL_BASED_OUTPATIENT_CLINIC_OR_DEPARTMENT_OTHER): Payer: Self-pay | Admitting: Family Medicine

## 2024-01-05 ENCOUNTER — Encounter (HOSPITAL_BASED_OUTPATIENT_CLINIC_OR_DEPARTMENT_OTHER): Payer: Self-pay | Admitting: *Deleted

## 2024-01-05 ENCOUNTER — Other Ambulatory Visit (HOSPITAL_BASED_OUTPATIENT_CLINIC_OR_DEPARTMENT_OTHER): Payer: Self-pay | Admitting: *Deleted

## 2024-01-05 DIAGNOSIS — I10 Essential (primary) hypertension: Secondary | ICD-10-CM

## 2024-01-05 MED ORDER — METOPROLOL SUCCINATE ER 100 MG PO TB24: 150.0000 mg | ORAL_TABLET | Freq: Every day | ORAL | 1 refills | Status: AC

## 2024-01-23 ENCOUNTER — Encounter (HOSPITAL_BASED_OUTPATIENT_CLINIC_OR_DEPARTMENT_OTHER): Payer: Self-pay | Admitting: Family Medicine

## 2024-01-23 ENCOUNTER — Ambulatory Visit (HOSPITAL_BASED_OUTPATIENT_CLINIC_OR_DEPARTMENT_OTHER): Admitting: Family Medicine

## 2024-01-23 VITALS — BP 171/90 | HR 65 | Temp 98.6°F | Resp 17 | Wt 250.6 lb

## 2024-01-23 DIAGNOSIS — I10 Essential (primary) hypertension: Secondary | ICD-10-CM

## 2024-01-23 DIAGNOSIS — R413 Other amnesia: Secondary | ICD-10-CM

## 2024-01-23 DIAGNOSIS — G43009 Migraine without aura, not intractable, without status migrainosus: Secondary | ICD-10-CM

## 2024-01-23 DIAGNOSIS — I872 Venous insufficiency (chronic) (peripheral): Secondary | ICD-10-CM

## 2024-01-23 DIAGNOSIS — G4733 Obstructive sleep apnea (adult) (pediatric): Secondary | ICD-10-CM

## 2024-01-23 DIAGNOSIS — S39012A Strain of muscle, fascia and tendon of lower back, initial encounter: Secondary | ICD-10-CM | POA: Diagnosis not present

## 2024-01-23 DIAGNOSIS — G43909 Migraine, unspecified, not intractable, without status migrainosus: Secondary | ICD-10-CM | POA: Insufficient documentation

## 2024-01-23 MED ORDER — HYDROCHLOROTHIAZIDE 12.5 MG PO TABS: 12.5000 mg | ORAL_TABLET | Freq: Every day | ORAL | 1 refills | Status: AC

## 2024-01-23 MED ORDER — AMLODIPINE BESYLATE-VALSARTAN 5-320 MG PO TABS: 1.0000 | ORAL_TABLET | Freq: Every day | ORAL | 1 refills | Status: AC

## 2024-01-23 MED ORDER — WEGOVY 0.25 MG/0.5ML ~~LOC~~ SOAJ: 0.2500 mg | SUBCUTANEOUS | 0 refills | Status: DC

## 2024-01-23 NOTE — Assessment & Plan Note (Addendum)
 Improved.  F/u with Neurology as directed.

## 2024-01-23 NOTE — Assessment & Plan Note (Addendum)
 Continues CPAP therapy for 2-3 years. Persistent daytime fatigue suggests possible CPAP inefficacy or coexisting narcolepsy. Referral to sleep specialist for further evaluation is pending. - Will check with staff regarding follow-up with sleep specialist. - Ensure CPAP settings are optimized.

## 2024-01-23 NOTE — Assessment & Plan Note (Signed)
 Migraines have improved with Sumatriptan , reducing frequency and severity. Nurtec was ineffective. Sumatriptan  provides relief within 30 minutes, though not completely eliminating migraines. - Continue Sumatriptan  as needed for migraine management.

## 2024-01-23 NOTE — Progress Notes (Signed)
 "  Established Patient Office Visit  Subjective   Patient ID: Benjamin Hodges, male    DOB: 06-05-1965  Age: 58 y.o. MRN: [id number]  Chief Complaint  Patient presents with   Follow-up    Follow-up     Discussed the use of AI scribe software for clinical note transcription with the patient, who gave verbal consent to proceed.  History of Present Illness Benjamin Hodges is a 58 year old male who presents with ongoing fatigue and back pain.  He has been experiencing migraines, initially treated with Nurtec without success. He takes this medication approximately once a week at the onset of symptoms, and it generally alleviates the symptoms within a few hours. While the migraines are not completely resolved, they are manageable and not debilitating.  He describes a persistent 'catch' in his back, located under the right shoulder blade, present for a few weeks. The pain is exacerbated by movement and has not improved with heat or Aleve. No recent injuries related to his back pain.  We reviewed the therapeutic options of Motrin with meals and PT.  He declines PT referral for now.  He has been using a CPAP machine for sleep apnea for two to three years. Despite this, he experiences significant daytime fatigue, which his wife attributes to possible narcolepsy. He can easily fall asleep during the day, even after being awake for several hours.  I have referred him to a sleep specialist, but this is evidently still pending.  He is currently on multiple medications, including those for blood pressure and cholesterol. He wants to reduce the number of medications he is taking. His blood pressure is usually around 140/90 mmHg, but it was 177 mmHg on the morning of the visit, as he had not taken his medication yet.  He has experienced swelling in his feet since discontinuing furosemide  and has been using over-the-counter diuretics and compression socks, which provide some relief. The compression  socks help but might need better fitting.  He reports a significant reduction in stress since changing jobs earlier this year, describing it as having 'almost zero' stress compared to his previous job.    Past Medical History:  Diagnosis Date   Depression    Former smoker    GERD (gastroesophageal reflux disease)    High cholesterol    Hypertension    Hypothyroidism    Memory loss    f/by Neuro   Migraine    f/by Neuro   Obesity    OSA on CPAP     Outpatient Encounter Medications as of 01/23/2024  Medication Sig   amLODipine -valsartan  (EXFORGE ) 5-320 MG tablet Take 1 tablet by mouth daily.   doxazosin  (CARDURA ) 8 MG tablet Take 0.5 tablets (4 mg total) by mouth at bedtime.   FLUoxetine  (PROZAC ) 40 MG capsule Take 1 capsule (40 mg total) by mouth every morning.   hydrochlorothiazide  (HYDRODIURIL ) 12.5 MG tablet Take 1 tablet (12.5 mg total) by mouth daily.   levothyroxine  (SYNTHROID ) 150 MCG tablet Take 1 tablet (150 mcg total) by mouth daily.   levothyroxine  (SYNTHROID ) 175 MCG tablet Take 1 tablet (175 mcg total) by mouth daily before breakfast for 1 dose.   metoprolol  succinate (TOPROL -XL) 100 MG 24 hr tablet Take 1.5 tablets (150 mg total) by mouth daily.   rosuvastatin  (CRESTOR ) 10 MG tablet TAKE 1 TABLET BY MOUTH AT BEDTIME   semaglutide -weight management (WEGOVY ) 0.25 MG/0.5ML SOAJ SQ injection Inject 0.25 mg into the skin once a week.   SUMAtriptan  (IMITREX )  100 MG tablet TAKE 1 TABLET BY MOUTH ONCE AS NEEDED FOR  UP  TO  1  DOSE  FOR  MIGRAINE.  MAY  REPEAT  IN  2  HOURS  IF  HEADACHE  PERSISTS  OR  RECURS.   [DISCONTINUED] amLODipine -valsartan  (EXFORGE ) 10-320 MG tablet Take 1 tablet by mouth once daily   No facility-administered encounter medications on file as of 01/23/2024.    Social History[1]    Review of Systems  Constitutional:  Positive for malaise/fatigue. Negative for diaphoresis, fever and weight loss.  Respiratory:  Negative for cough, shortness of  breath and wheezing.   Cardiovascular:  Positive for leg swelling. Negative for chest pain, palpitations, orthopnea, claudication and PND.      Objective:     BP (!) 171/90 (Cuff Size: Large)   Pulse 65   Temp 98.6 F (37 C) (Oral)   Resp 17   Wt 250 lb 9.6 oz (113.7 kg)   SpO2 97%   BMI 37.01 kg/m    Physical Exam Constitutional:      General: He is not in acute distress.    Appearance: Normal appearance. He is obese.  HENT:     Head: Normocephalic.  Neck:     Vascular: No carotid bruit.  Cardiovascular:     Rate and Rhythm: Normal rate and regular rhythm.     Pulses: Normal pulses.     Heart sounds: Normal heart sounds.  Pulmonary:     Effort: Pulmonary effort is normal.     Breath sounds: Normal breath sounds.  Abdominal:     General: Bowel sounds are normal.     Palpations: Abdomen is soft.  Musculoskeletal:     Cervical back: Neck supple. No tenderness.     Right lower leg: Edema present.     Left lower leg: Edema present.  Neurological:     Mental Status: He is alert.      No results found for any visits on 01/23/24.    The ASCVD Risk score (Arnett DK, et al., 2019) failed to calculate for the following reasons:   Cannot find a previous HDL lab   Cannot find a previous total cholesterol lab   * - Cholesterol units were assumed    Assessment & Plan:   Assessment & Plan Memory disturbance Improved.  F/u with Neurology as directed.     Primary hypertension Blood pressure is generally controlled at home, typically around 140/90 mmHg. DASH diet.  Reduce his Amlodipine  to 5mg  daily due to LE edema, but resume a low dose diuretic. Orders:   hydrochlorothiazide  (HYDRODIURIL ) 12.5 MG tablet; Take 1 tablet (12.5 mg total) by mouth daily.  Strain of lumbar region, initial encounter Persistent pain under the right shoulder blade, exacerbated by movement. No specific injury recalled. Pain management with ibuprofen and stretching has been  insufficient. - Recommended physical therapy for back pain management, but he declines.       OSA on CPAP Continues CPAP therapy for 2-3 years. Persistent daytime fatigue suggests possible CPAP inefficacy or coexisting narcolepsy. Referral to sleep specialist for further evaluation is pending. - Will check with staff regarding follow-up with sleep specialist. - Ensure CPAP settings are optimized.     Morbid obesity (HCC) Comorbidities:  HTN, Lipids Weight loss could reduce medication burden. Stress management and lifestyle changes are encouraged. - Encouraged weight loss through lifestyle modifications. Orders:   semaglutide -weight management (WEGOVY ) 0.25 MG/0.5ML SOAJ SQ injection; Inject 0.25 mg into the skin once a  week.  Venous insufficiency Persistent lower extremity edema despite over-the-counter diuretics. Compression stockings provide some relief but are not fully effective. - Recommended better fitting compression stockings. - See if this improved with the reduction in his Amlodipine  dosage.    Migraine without aura and without status migrainosus, not intractable Migraines have improved with Sumatriptan , reducing frequency and severity. Nurtec was ineffective. Sumatriptan  provides relief within 30 minutes, though not completely eliminating migraines. - Continue Sumatriptan  as needed for migraine management.      Return in about 4 weeks (around 02/20/2024) for chronic follow-up.    REDDING PONCE NORLEEN FALCON., MD     [1]  Social History Tobacco Use   Smoking status: Former    Current packs/day: 0.00    Average packs/day: 1 pack/day for 8.0 years (8.0 ttl pk-yrs)    Types: Cigarettes, Cigars    Start date: 49    Quit date: 2000    Years since quitting: 26.0   Smokeless tobacco: Never  Vaping Use   Vaping status: Never Used  Substance Use Topics   Alcohol use: Yes    Alcohol/week: 6.0 standard drinks of alcohol    Types: 6 Cans of beer per week   Drug use: No    "

## 2024-01-23 NOTE — Assessment & Plan Note (Addendum)
 Comorbidities:  HTN, Lipids Weight loss could reduce medication burden. Stress management and lifestyle changes are encouraged. - Encouraged weight loss through lifestyle modifications. Orders:   semaglutide -weight management (WEGOVY ) 0.25 MG/0.5ML SOAJ SQ injection; Inject 0.25 mg into the skin once a week.

## 2024-01-23 NOTE — Assessment & Plan Note (Addendum)
 Persistent lower extremity edema despite over-the-counter diuretics. Compression stockings provide some relief but are not fully effective. - Recommended better fitting compression stockings. - See if this improved with the reduction in his Amlodipine  dosage.

## 2024-01-23 NOTE — Assessment & Plan Note (Addendum)
 Blood pressure is generally controlled at home, typically around 140/90 mmHg. DASH diet.  Reduce his Amlodipine  to 5mg  daily due to LE edema, but resume a low dose diuretic. Orders:   hydrochlorothiazide  (HYDRODIURIL ) 12.5 MG tablet; Take 1 tablet (12.5 mg total) by mouth daily.

## 2024-01-23 NOTE — Assessment & Plan Note (Addendum)
 Persistent pain under the right shoulder blade, exacerbated by movement. No specific injury recalled. Pain management with ibuprofen and stretching has been insufficient. - Recommended physical therapy for back pain management, but he declines.

## 2024-01-26 ENCOUNTER — Telehealth (HOSPITAL_BASED_OUTPATIENT_CLINIC_OR_DEPARTMENT_OTHER): Payer: Self-pay | Admitting: Pharmacy Technician

## 2024-01-26 ENCOUNTER — Other Ambulatory Visit (HOSPITAL_COMMUNITY): Payer: Self-pay

## 2024-01-26 NOTE — Telephone Encounter (Signed)
 Pharmacy Patient Advocate Encounter   Received notification from Onbase that prior authorization for Wegovy  0.25MG /0.5ML auto-injectors is required/requested.   Insurance verification completed.   The patient is insured through [facility].   Per test claim: Per test claim, medication is not covered due to plan/benefit exclusion, PA not submitted at this time  Not covered when used for weight loss. Plan covers for CV reduction risk with one of the following. Patient has 2 insurances and both rejected as not covered for weight loss.  Does pt have evidence of any of the following: Myocardial infarction (MI), (2) Prior ischemic or hemorrhagic stroke, (3) Symptomatic peripheral arterial disease (PAD) as evidence by one of the following (a) Intermittent claudication with ankle-brachial index (ABI) less than 0.85 (at rest); (b) Peripheral arterial revascularization procedure; (c) Amputation due to atherosclerotic disease?       If you can obtain a copy of the patient's sleep study to confirm moderate to severe OSA with an AHI of greater than 15 we may be able to get Zepbound approved.  CMM Key# [id number]

## 2024-01-31 ENCOUNTER — Other Ambulatory Visit (HOSPITAL_BASED_OUTPATIENT_CLINIC_OR_DEPARTMENT_OTHER): Payer: Self-pay | Admitting: Family Medicine

## 2024-02-24 ENCOUNTER — Other Ambulatory Visit: Payer: Self-pay | Admitting: Medical Genetics

## 2024-02-24 ENCOUNTER — Ambulatory Visit (INDEPENDENT_AMBULATORY_CARE_PROVIDER_SITE_OTHER): Admitting: Family Medicine

## 2024-02-24 ENCOUNTER — Encounter (HOSPITAL_BASED_OUTPATIENT_CLINIC_OR_DEPARTMENT_OTHER): Payer: Self-pay | Admitting: Family Medicine

## 2024-02-24 VITALS — BP 169/83 | HR 55 | Temp 97.4°F | Resp 16 | Wt 250.0 lb

## 2024-02-24 DIAGNOSIS — G4733 Obstructive sleep apnea (adult) (pediatric): Secondary | ICD-10-CM

## 2024-02-24 DIAGNOSIS — R413 Other amnesia: Secondary | ICD-10-CM

## 2024-02-24 DIAGNOSIS — I10 Essential (primary) hypertension: Secondary | ICD-10-CM

## 2024-02-24 NOTE — Progress Notes (Unsigned)
 "  Established Patient Office Visit  Subjective   Patient ID: Rodrick Payson, male    DOB: [birth year]  Age: 59 y.o. MRN: [id number]  Chief Complaint  Patient presents with   Follow-up    Follow-up    Discussed the use of AI scribe software for clinical note transcription with the patient, who gave verbal consent to proceed.  History of Present Illness Karron Alvizo is a 59 year old male with hypertension and sleep apnea who presents for a follow-up visit.  He has hypertension with recent home blood pressure readings of 143/84 mmHg. He is on Cardura  and is cautious about dietary salt intake. He has not seen a cardiologist in the past three to five years but had a stress test about two years ago.  He has sleep apnea and uses a CPAP machine. His wife has noticed snoring even with the mask on, but he feels he does not need naps as much as before, indicating some level of control. He has had the CPAP machine for a couple of years.  His memory has improved significantly, with fewer instances of forgetfulness at work and home. He feels his memory is 'a whole lot better than it was.'  He is not taking Wegovy  due to cost, as insurance does not cover it, and it is too expensive without coverage. He is trying to modify his diet, especially after recent holidays and his birthday.  He quit smoking in the past, which he acknowledges was beneficial for his health.  He takes Cardura  and has not had significant prostate problems. He urinates once or twice a night.    Past Medical History:  Diagnosis Date   ASCVD (arteriosclerotic cardiovascular disease)    known to Dr. Jordan with normal stress test 2024   Depression    Former smoker    GERD (gastroesophageal reflux disease)    High cholesterol    Hypertension    Hypothyroidism    Memory loss    f/by Neuro   Migraine    f/by Neuro   Obesity    OSA on CPAP     Outpatient Encounter Medications as of 02/24/2024  Medication Sig    amLODipine -valsartan  (EXFORGE ) 5-320 MG tablet Take 1 tablet by mouth daily.   doxazosin  (CARDURA ) 8 MG tablet Take 0.5 tablets (4 mg total) by mouth at bedtime.   FLUoxetine  (PROZAC ) 40 MG capsule Take 1 capsule (40 mg total) by mouth every morning.   hydrochlorothiazide  (HYDRODIURIL ) 12.5 MG tablet Take 1 tablet (12.5 mg total) by mouth daily.   levothyroxine  (SYNTHROID ) 175 MCG tablet TAKE 1 TABLET BY MOUTH ONCE DAILY BEFORE BREAKFAST   metoprolol  succinate (TOPROL -XL) 100 MG 24 hr tablet Take 1.5 tablets (150 mg total) by mouth daily.   rosuvastatin  (CRESTOR ) 10 MG tablet TAKE 1 TABLET BY MOUTH AT BEDTIME   SUMAtriptan  (IMITREX ) 100 MG tablet TAKE 1 TABLET BY MOUTH ONCE AS NEEDED FOR  UP  TO  1  DOSE  FOR  MIGRAINE.  MAY  REPEAT  IN  2  HOURS  IF  HEADACHE  PERSISTS  OR  RECURS.   [DISCONTINUED] semaglutide -weight management (WEGOVY ) 0.25 MG/0.5ML SOAJ SQ injection Inject 0.25 mg into the skin once a week.   No facility-administered encounter medications on file as of 02/24/2024.    Social History[1]  {History (Optional):23778}  ROS    Objective:     BP (!) 169/83   Pulse (!) 55   Temp (!) 97.4 F (36.3 C) (Oral)  Resp 16   Wt 250 lb (113.4 kg)   SpO2 95%   BMI 36.92 kg/m  {Vitals History (Optional):23777}  Physical Exam Constitutional:      General: He is not in acute distress.    Appearance: Normal appearance. He is obese.  HENT:     Head: Normocephalic.  Neck:     Vascular: No carotid bruit.  Cardiovascular:     Rate and Rhythm: Normal rate and regular rhythm.     Pulses: Normal pulses.     Heart sounds: Normal heart sounds.  Pulmonary:     Effort: Pulmonary effort is normal.     Breath sounds: Normal breath sounds.  Abdominal:     General: Bowel sounds are normal.     Palpations: Abdomen is soft.  Musculoskeletal:     Cervical back: Neck supple. No tenderness.     Right lower leg: No edema.     Left lower leg: No edema.  Neurological:     Mental  Status: He is alert.      No results found for any visits on 02/24/24.  {Labs (Optional):23779}  The ASCVD Risk score (Arnett DK, et al., 2019) failed to calculate for the following reasons:   Cannot find a previous HDL lab   Cannot find a previous total cholesterol lab   * - Cholesterol units were assumed    Assessment & Plan:   Assessment & Plan   Assessment and Plan Assessment & Plan Hypertension Blood pressure is not optimally controlled with current regimen. Home readings are around 143/84 mmHg. Current medications include Exforge  and Cardura  (doxazosin ). Amlodipine  previously caused significant leg swelling. Increasing Cardura  is considered due to its dual benefit for hypertension and potential prostate issues. Increasing the diuretic is not preferred due to increased urination risk. Discussed potential dizziness with increased Cardura  dosage. If ineffective, referral to a hypertensive PharmD clinic may be considered. Discussed rare possibility of renal artery stenosis as a cause of hypertension. - Increased Cardura  (doxazosin ) from 4 mg to 8 mg at bedtime. - Instructed to monitor blood pressure and report back in 2-3 days regarding tolerance and effectiveness. - If tolerated and effective, will refill Cardura  at 8 mg for 90 days. - If ineffective, will consider referral to a hypertensive PharmD clinic.  Obstructive sleep apnea CPAP machine in use for a couple of years. Reports snoring persists despite CPAP use. Wife has noticed snoring. He feels less need for naps, indicating some improvement. Decision to hold off on referral to a sleep specialist as current management is deemed acceptable. - Continue current CPAP therapy. - Monitor symptoms and consider referral to a sleep specialist if symptoms worsen.  Memory impairment Reports significant improvement in memory function. No longer experiencing noticeable memory lapses at work or home. Feels memory is back on track. -  Continue current management and monitor for any changes in memory function.     Return in about 3 months (around 05/24/2024) for chronic follow-up.    REDDING PONCE NORLEEN FALCON., MD    [1]  Social History Tobacco Use   Smoking status: Former    Current packs/day: 0.00    Average packs/day: 1 pack/day for 8.0 years (8.0 ttl pk-yrs)    Types: Cigarettes, Cigars    Start date: 12    Quit date: 2000    Years since quitting: 26.0   Smokeless tobacco: Never  Vaping Use   Vaping status: Never Used  Substance Use Topics   Alcohol use: Yes  Alcohol/week: 6.0 standard drinks of alcohol    Types: 6 Cans of beer per week   Drug use: No   "

## 2024-02-25 NOTE — Assessment & Plan Note (Signed)
 Admits regular dietary noncompliance and I urged him to work harder on this.

## 2024-02-25 NOTE — Assessment & Plan Note (Signed)
 Comorbidities: HTN, Lipids

## 2024-02-25 NOTE — Assessment & Plan Note (Signed)
 Blood pressure is not optimally controlled with current regimen. Home readings are around 143/84 mmHg. Current medications include Exforge  and Cardura  (doxazosin ). Amlodipine  previously caused significant leg swelling. Increasing Cardura  is considered due to its dual benefit for hypertension and potential prostate issues. Increasing the diuretic is not preferred due to concerns of intolerance.  Discussed potential dizziness with increased Cardura  dosage. If ineffective, referral to a hypertensive PharmD clinic may be considered. Discussed rare possibility of renal artery stenosis as a cause of hypertension. - Increased Cardura  (doxazosin ) from 4 mg to 8 mg at bedtime. - Instructed to monitor blood pressure and report back in 2-3 days regarding tolerance and effectiveness. - If tolerated and effective, will refill Cardura  at 8 mg for 90 days. - If ineffective, will consider workup for secondary HTN, and referral to a hypertensive PharmD clinic.

## 2024-02-25 NOTE — Assessment & Plan Note (Signed)
 Clinically under fair control despite some concerning history.  Reports snoring persists despite CPAP use. Wife has noticed snoring. He feels less need for naps, indicating improvement. Decision to hold off on referral to a sleep specialist as current management is deemed acceptable. - Continue current CPAP therapy. - Monitor symptoms and consider referral to a sleep specialist if symptoms worsen.

## 2024-02-25 NOTE — Assessment & Plan Note (Signed)
 Reports significant improvement in memory function. No longer experiencing noticeable memory lapses at work or home. Feels memory is back on track. - Continue current management and monitor for any changes in memory function.

## 2024-05-24 ENCOUNTER — Ambulatory Visit (HOSPITAL_BASED_OUTPATIENT_CLINIC_OR_DEPARTMENT_OTHER): Admitting: Family Medicine

## 2024-06-16 ENCOUNTER — Ambulatory Visit: Admitting: Neurology
